# Patient Record
Sex: Female | Born: 1960 | Race: Black or African American | Hispanic: No | Marital: Married | Smoking: Never smoker
Health system: Southern US, Community
[De-identification: ages and names within clinical notes are randomized; demographics above are authoritative.]

## PROBLEM LIST (undated history)

## (undated) DIAGNOSIS — J302 Other seasonal allergic rhinitis: Secondary | ICD-10-CM

## (undated) DIAGNOSIS — E2839 Other primary ovarian failure: Secondary | ICD-10-CM

## (undated) DIAGNOSIS — J45909 Unspecified asthma, uncomplicated: Secondary | ICD-10-CM

## (undated) DIAGNOSIS — F419 Anxiety disorder, unspecified: Secondary | ICD-10-CM

## (undated) DIAGNOSIS — E785 Hyperlipidemia, unspecified: Secondary | ICD-10-CM

## (undated) DIAGNOSIS — E039 Hypothyroidism, unspecified: Secondary | ICD-10-CM

## (undated) DIAGNOSIS — Z862 Personal history of diseases of the blood and blood-forming organs and certain disorders involving the immune mechanism: Secondary | ICD-10-CM

## (undated) DIAGNOSIS — M25551 Pain in right hip: Secondary | ICD-10-CM

## (undated) DIAGNOSIS — N6001 Solitary cyst of right breast: Secondary | ICD-10-CM

## (undated) HISTORY — DX: Hypothyroidism, unspecified: E03.9

## (undated) HISTORY — DX: Other seasonal allergic rhinitis: J30.2

## (undated) HISTORY — DX: Pain in right hip: M25.551

## (undated) HISTORY — DX: Other primary ovarian failure: E28.39

## (undated) HISTORY — DX: Unspecified asthma, uncomplicated: J45.909

## (undated) HISTORY — DX: Morbid (severe) obesity due to excess calories: E66.01

## (undated) HISTORY — DX: Anxiety disorder, unspecified: F41.9

## (undated) HISTORY — DX: Hyperlipidemia, unspecified: E78.5

## (undated) HISTORY — DX: Solitary cyst of right breast: N60.01

## (undated) HISTORY — DX: Personal history of diseases of the blood and blood-forming organs and certain disorders involving the immune mechanism: Z86.2

---

## 1998-09-02 ENCOUNTER — Other Ambulatory Visit: Admission: RE | Admit: 1998-09-02 | Discharge: 1998-09-02 | Payer: Self-pay | Admitting: Obstetrics and Gynecology

## 1998-09-13 ENCOUNTER — Ambulatory Visit (HOSPITAL_COMMUNITY): Admission: RE | Admit: 1998-09-13 | Discharge: 1998-09-13 | Payer: Self-pay | Admitting: Obstetrics and Gynecology

## 1998-09-13 ENCOUNTER — Encounter: Payer: Self-pay | Admitting: Obstetrics and Gynecology

## 2000-09-19 ENCOUNTER — Other Ambulatory Visit: Admission: RE | Admit: 2000-09-19 | Discharge: 2000-09-19 | Payer: Self-pay | Admitting: Obstetrics and Gynecology

## 2001-12-03 ENCOUNTER — Other Ambulatory Visit: Admission: RE | Admit: 2001-12-03 | Discharge: 2001-12-03 | Payer: Self-pay | Admitting: Obstetrics and Gynecology

## 2005-10-09 ENCOUNTER — Encounter: Admission: RE | Admit: 2005-10-09 | Discharge: 2005-10-09 | Payer: Self-pay | Admitting: Internal Medicine

## 2005-11-08 ENCOUNTER — Other Ambulatory Visit: Admission: RE | Admit: 2005-11-08 | Discharge: 2005-11-08 | Payer: Self-pay | Admitting: Obstetrics and Gynecology

## 2005-11-08 ENCOUNTER — Encounter: Admission: RE | Admit: 2005-11-08 | Discharge: 2005-11-08 | Payer: Self-pay | Admitting: Internal Medicine

## 2006-03-16 ENCOUNTER — Encounter: Admission: RE | Admit: 2006-03-16 | Discharge: 2006-03-16 | Payer: Self-pay | Admitting: General Surgery

## 2006-09-21 ENCOUNTER — Encounter: Admission: RE | Admit: 2006-09-21 | Discharge: 2006-09-21 | Payer: Self-pay | Admitting: Obstetrics and Gynecology

## 2006-11-26 ENCOUNTER — Encounter: Admission: RE | Admit: 2006-11-26 | Discharge: 2006-11-26 | Payer: Self-pay | Admitting: Obstetrics and Gynecology

## 2008-04-09 ENCOUNTER — Encounter: Admission: RE | Admit: 2008-04-09 | Discharge: 2008-04-09 | Payer: Self-pay | Admitting: Obstetrics and Gynecology

## 2008-05-07 ENCOUNTER — Encounter: Admission: RE | Admit: 2008-05-07 | Discharge: 2008-05-07 | Payer: Self-pay | Admitting: Obstetrics and Gynecology

## 2008-12-24 ENCOUNTER — Encounter: Admission: RE | Admit: 2008-12-24 | Discharge: 2008-12-24 | Payer: Self-pay | Admitting: Obstetrics and Gynecology

## 2009-10-05 ENCOUNTER — Encounter: Admission: RE | Admit: 2009-10-05 | Discharge: 2009-10-05 | Payer: Self-pay | Admitting: Obstetrics and Gynecology

## 2010-02-18 ENCOUNTER — Encounter: Admission: RE | Admit: 2010-02-18 | Discharge: 2010-02-18 | Payer: Self-pay | Admitting: Internal Medicine

## 2010-05-22 ENCOUNTER — Encounter: Payer: Self-pay | Admitting: Obstetrics and Gynecology

## 2010-06-24 ENCOUNTER — Emergency Department (HOSPITAL_COMMUNITY)
Admission: EM | Admit: 2010-06-24 | Discharge: 2010-06-25 | Disposition: A | Discharge status: Home / Self Care | Payer: Managed Care, Other (non HMO) | Attending: Emergency Medicine | Admitting: Emergency Medicine

## 2010-06-24 ENCOUNTER — Emergency Department (HOSPITAL_COMMUNITY): Payer: Managed Care, Other (non HMO)

## 2010-06-24 DIAGNOSIS — R110 Nausea: Secondary | ICD-10-CM | POA: Insufficient documentation

## 2010-06-24 DIAGNOSIS — R109 Unspecified abdominal pain: Secondary | ICD-10-CM | POA: Insufficient documentation

## 2010-06-24 LAB — POCT PREGNANCY, URINE: Preg Test, Ur: NEGATIVE

## 2010-06-24 LAB — WET PREP, GENITAL
Clue Cells Wet Prep HPF POC: NONE SEEN
Trich, Wet Prep: NONE SEEN

## 2010-06-24 LAB — CBC
HCT: 35.4 % — ABNORMAL LOW (ref 36.0–46.0)
Hemoglobin: 11.3 g/dL — ABNORMAL LOW (ref 12.0–15.0)
MCHC: 31.9 g/dL (ref 30.0–36.0)
MCV: 79.2 fL (ref 78.0–100.0)
RBC: 4.47 MIL/uL (ref 3.87–5.11)
WBC: 11.1 10*3/uL — ABNORMAL HIGH (ref 4.0–10.5)

## 2010-06-24 LAB — COMPREHENSIVE METABOLIC PANEL
ALT: 45 U/L — ABNORMAL HIGH (ref 0–35)
CO2: 24 mEq/L (ref 19–32)
Chloride: 105 mEq/L (ref 96–112)
Creatinine, Ser: 0.96 mg/dL (ref 0.4–1.2)
Glucose, Bld: 121 mg/dL — ABNORMAL HIGH (ref 70–99)
Potassium: 3.6 mEq/L (ref 3.5–5.1)
Total Bilirubin: 0.7 mg/dL (ref 0.3–1.2)
Total Protein: 6.5 g/dL (ref 6.0–8.3)

## 2010-06-24 LAB — URINALYSIS, ROUTINE W REFLEX MICROSCOPIC
Bilirubin Urine: NEGATIVE
Nitrite: NEGATIVE
Specific Gravity, Urine: 1.022 (ref 1.005–1.030)
Urine Glucose, Fasting: NEGATIVE mg/dL
Urobilinogen, UA: 0.2 mg/dL (ref 0.0–1.0)

## 2010-06-24 LAB — DIFFERENTIAL
Basophils Relative: 0 % (ref 0–1)
Eosinophils Absolute: 0 10*3/uL (ref 0.0–0.7)
Lymphocytes Relative: 3 % — ABNORMAL LOW (ref 12–46)
Lymphs Abs: 0.4 10*3/uL — ABNORMAL LOW (ref 0.7–4.0)
Monocytes Absolute: 0.3 10*3/uL (ref 0.1–1.0)
Monocytes Relative: 3 % (ref 3–12)
Neutro Abs: 10.4 10*3/uL — ABNORMAL HIGH (ref 1.7–7.7)

## 2010-06-24 LAB — URINE MICROSCOPIC-ADD ON

## 2010-06-24 MED ORDER — IOHEXOL 300 MG/ML  SOLN
100.0000 mL | Freq: Once | INTRAMUSCULAR | Status: AC | PRN
  Administered 2010-06-24: 100 mL via INTRAVENOUS

## 2010-08-22 ENCOUNTER — Emergency Department (HOSPITAL_BASED_OUTPATIENT_CLINIC_OR_DEPARTMENT_OTHER)
Admission: EM | Admit: 2010-08-22 | Discharge: 2010-08-22 | Disposition: A | Discharge status: Home / Self Care | Payer: Managed Care, Other (non HMO) | Attending: Emergency Medicine | Admitting: Emergency Medicine

## 2010-08-22 DIAGNOSIS — M79609 Pain in unspecified limb: Secondary | ICD-10-CM | POA: Insufficient documentation

## 2010-08-22 DIAGNOSIS — J45909 Unspecified asthma, uncomplicated: Secondary | ICD-10-CM | POA: Insufficient documentation

## 2011-05-10 ENCOUNTER — Other Ambulatory Visit: Payer: Self-pay | Admitting: Obstetrics and Gynecology

## 2011-05-10 DIAGNOSIS — Z1231 Encounter for screening mammogram for malignant neoplasm of breast: Secondary | ICD-10-CM

## 2011-05-19 ENCOUNTER — Ambulatory Visit: Payer: Managed Care, Other (non HMO)

## 2011-06-01 ENCOUNTER — Ambulatory Visit
Admission: RE | Admit: 2011-06-01 | Discharge: 2011-06-01 | Disposition: A | Discharge status: Home / Self Care | Payer: Managed Care, Other (non HMO) | Source: Ambulatory Visit | Attending: Obstetrics and Gynecology | Admitting: Obstetrics and Gynecology

## 2011-06-01 DIAGNOSIS — Z1231 Encounter for screening mammogram for malignant neoplasm of breast: Secondary | ICD-10-CM

## 2011-11-25 ENCOUNTER — Ambulatory Visit: Payer: 59 | Admitting: Emergency Medicine

## 2011-11-25 VITALS — BP 125/88 | HR 92 | Temp 98.2°F | Resp 18 | Ht 65.0 in | Wt 232.4 lb

## 2011-11-25 DIAGNOSIS — R21 Rash and other nonspecific skin eruption: Secondary | ICD-10-CM

## 2011-11-25 DIAGNOSIS — L509 Urticaria, unspecified: Secondary | ICD-10-CM

## 2011-11-25 MED ORDER — PREDNISONE 10 MG PO TABS: ORAL_TABLET | ORAL | Status: DC

## 2011-11-25 MED ORDER — BETAMETHASONE DIPROPIONATE AUG 0.05 % EX CREA: TOPICAL_CREAM | Freq: Two times a day (BID) | CUTANEOUS | Status: AC

## 2011-11-25 MED ORDER — METHYLPREDNISOLONE ACETATE 80 MG/ML IJ SUSP
80.0000 mg | Freq: Once | INTRAMUSCULAR | Status: AC
  Administered 2011-11-25: 80 mg via INTRAMUSCULAR

## 2011-11-25 NOTE — Progress Notes (Signed)
**Note De-identified Umanzor Obfuscation**  **Note De-Identified Watkin Obfuscation** Subjective:    Patient ID: Bethany Carr, female    DOB: [birth year], 51 y.o.   MRN: [id number]  HPI pt presents with rash on chest, back, on eyes. Has used hydrocortisone cream. Started on Wednesday evening. Didn't do anything unusual. Ate leftover chicken. No seafood, nuts. No new creams or lotions. Says this happens about once a year. 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 Used to work at [employer], in the office. Wonders if it has been stress-related. States she doesn't feel stressed now since she switched jobs.     Review of Systems     Objective:   Physical Exam or hives noted over the anterior chest and over the back. Some of these areas are excoriated neck is supple chest is clear .        Assessment & Plan:  Assessment is hives. We'll go ahead and treat with Depo-Medrol prednisone Dosepak continue Claritin one a day Benadryl at night

## 2012-04-27 ENCOUNTER — Ambulatory Visit (INDEPENDENT_AMBULATORY_CARE_PROVIDER_SITE_OTHER): Payer: 59 | Admitting: Family Medicine

## 2012-04-27 VITALS — BP 122/84 | HR 109 | Temp 99.4°F | Resp 16 | Ht 64.0 in | Wt 231.0 lb

## 2012-04-27 DIAGNOSIS — H6690 Otitis media, unspecified, unspecified ear: Secondary | ICD-10-CM

## 2012-04-27 DIAGNOSIS — R05 Cough: Secondary | ICD-10-CM

## 2012-04-27 DIAGNOSIS — R059 Cough, unspecified: Secondary | ICD-10-CM

## 2012-04-27 DIAGNOSIS — H6500 Acute serous otitis media, unspecified ear: Secondary | ICD-10-CM

## 2012-04-27 DIAGNOSIS — R21 Rash and other nonspecific skin eruption: Secondary | ICD-10-CM

## 2012-04-27 DIAGNOSIS — J45909 Unspecified asthma, uncomplicated: Secondary | ICD-10-CM

## 2012-04-27 LAB — POCT CBC
Hemoglobin: 13.6 g/dL (ref 12.2–16.2)
Lymph, poc: 2 (ref 0.6–3.4)
MCH, POC: 26.6 pg — AB (ref 27–31.2)
MCHC: 31 g/dL — AB (ref 31.8–35.4)
MCV: 85.7 fL (ref 80–97)
MID (cbc): 0.4 (ref 0–0.9)
POC MID %: 5.3 %M (ref 0–12)
Platelet Count, POC: 177 10*3/uL (ref 142–424)
RBC: 5.12 M/uL (ref 4.04–5.48)
WBC: 7.7 10*3/uL (ref 4.6–10.2)

## 2012-04-27 LAB — POCT INFLUENZA A/B: Influenza B, POC: NEGATIVE

## 2012-04-27 MED ORDER — HYDROCODONE-HOMATROPINE 5-1.5 MG/5ML PO SYRP: 5.0000 mL | ORAL_SOLUTION | Freq: Three times a day (TID) | ORAL | Status: DC | PRN

## 2012-04-27 MED ORDER — ALBUTEROL SULFATE HFA 108 (90 BASE) MCG/ACT IN AERS: 2.0000 | INHALATION_SPRAY | RESPIRATORY_TRACT | Status: DC | PRN

## 2012-04-27 MED ORDER — PREDNISONE 10 MG PO TABS: ORAL_TABLET | ORAL | Status: DC

## 2012-04-27 MED ORDER — AMOXICILLIN 875 MG PO TABS: 875.0000 mg | ORAL_TABLET | Freq: Three times a day (TID) | ORAL | Status: DC

## 2012-04-27 NOTE — Progress Notes (Signed)
**Note De-Identified Strauser Obfuscation** Subjective:    Patient ID: Bethany Carr, female    DOB: [birth year], 51 y.o.   MRN: [id number]  HPI  7d of chest congestion, rattling, coughing but couldn't get anything up for some time, then started coming up dark yellow-green, now moved into head w/ headache, earache, sinus pressure and nasal congestion. Sore throat with coughing only.  Aching all over.  Has had sweats and chills, skin feels like it was buring. Taking ibuprofen, guaifenisen and sudafed, No n/v. nml uop but constipaiton resolved. tol po and trying to push fluids but knows she prob isn't drinking enough.  Past Medical History  Diagnosis Date  . Asthma    Current Outpatient Prescriptions on File Prior to Visit  Medication Sig Dispense Refill  . augmented betamethasone dipropionate (DIPROLENE AF) 0.05 % cream Apply topically 2 (two) times daily.  50 g  0  . predniSONE (DELTASONE) 10 MG tablet Takes 6 a day for one day 5 a day for one day 4 day for one day 3 a day for one day 2 a day for one day and one a day for one day  21 tablet  0   No Known Allergies   Review of Systems  Constitutional: Positive for chills, diaphoresis, activity change, appetite change and fatigue. Negative for fever and unexpected weight change.  HENT: Positive for ear pain, congestion, rhinorrhea, neck pain and sinus pressure. Negative for nosebleeds, sore throat, facial swelling, sneezing, mouth sores, trouble swallowing, neck stiffness, voice change, postnasal drip and ear discharge.   Eyes: Positive for photophobia. Negative for pain.  Respiratory: Positive for cough, chest tightness and wheezing. Negative for shortness of breath.   Cardiovascular: Negative for chest pain.  Gastrointestinal: Positive for constipation. Negative for nausea, vomiting and abdominal pain.  Genitourinary: Negative for dysuria, frequency and decreased urine volume.  Musculoskeletal: Positive for myalgias and arthralgias. Negative for joint swelling and gait problem.    Neurological: Positive for weakness and headaches. Negative for dizziness, syncope and light-headedness.  Hematological: Negative for adenopathy.  Psychiatric/Behavioral: Positive for sleep disturbance.      BP 122/84  Pulse 109  Temp 99.4 F (37.4 C) (Oral)  Resp 16  Ht 5\' 4"  (1.626 m)  Wt 231 lb (104.781 kg)  BMI 39.65 kg/m2 O2 sat 98% Objective:   Physical Exam  Constitutional: She is oriented to person, place, and time. She appears well-developed and well-nourished. She appears lethargic. She appears ill. No distress.  HENT:  Head: Normocephalic and atraumatic.  Right Ear: External ear and ear canal normal. Tympanic membrane is injected and retracted. A middle ear effusion is present.  Left Ear: External ear and ear canal normal. Tympanic membrane is injected and bulging. A middle ear effusion is present.  Nose: Mucosal edema and rhinorrhea present. Right sinus exhibits maxillary sinus tenderness. Left sinus exhibits maxillary sinus tenderness.  Mouth/Throat: Uvula is midline, oropharynx is clear and moist and mucous membranes are normal. No oropharyngeal exudate, posterior oropharyngeal edema, posterior oropharyngeal erythema or tonsillar abscesses.  Eyes: Conjunctivae normal are normal. Right eye exhibits no discharge. Left eye exhibits no discharge. No scleral icterus.  Neck: Normal range of motion. Neck supple.  Cardiovascular: Normal rate, regular rhythm, normal heart sounds and intact distal pulses.   Pulmonary/Chest: Effort normal. No accessory muscle usage. No respiratory distress. She has decreased breath sounds. She has wheezes in the right lower field and the left lower field.  Lymphadenopathy:       Head (right side): Submandibular adenopathy present. **Note De-Identified Guillen Obfuscation** No preauricular and no posterior auricular adenopathy present.       Head (left side): Submandibular adenopathy present. No preauricular and no posterior auricular adenopathy present.    She has no cervical adenopathy.        Right: No supraclavicular adenopathy present.       Left: No supraclavicular adenopathy present.  Neurological: She is oriented to person, place, and time. She appears lethargic.  Skin: Skin is warm and dry. She is not diaphoretic. No erythema.  Psychiatric: She has a normal mood and affect. Her behavior is normal.      Results for orders placed in visit on 04/27/12  POCT INFLUENZA A/B      Component Value Range   Influenza A, POC Negative     Influenza B, POC Negative    POCT CBC      Component Value Range   WBC 7.7  4.6 - 10.2 K/uL   Lymph, poc 2.0  0.6 - 3.4   POC LYMPH PERCENT 25.4  10 - 50 %L   MID (cbc) 0.4  0 - 0.9   POC MID % 5.3  0 - 12 %M   POC Granulocyte 5.3  2 - 6.9   Granulocyte percent 69.3  37 - 80 %G   RBC 5.12  4.04 - 5.48 M/uL   Hemoglobin 13.6  12.2 - 16.2 g/dL   HCT, POC 16.1  09.6 - 47.9 %   MCV 85.7  80 - 97 fL   MCH, POC 26.6 (*) 27 - 31.2 pg   MCHC 31.0 (*) 31.8 - 35.4 g/dL   RDW, POC 04.5     Platelet Count, POC 177  142 - 424 K/uL   MPV 11.2  0 - 99.8 fL    Assessment & Plan:   1. Cough  POCT Influenza A/B, POCT CBC, HYDROcodone-homatropine (HYCODAN) 5-1.5 MG/5ML syrup      3. Otitis media  amoxicillin (AMOXIL) 875 MG tablet  4. Asthma  predniSONE (DELTASONE) 10 MG tablet; albuterol inhaler q4hrs prn

## 2013-02-17 ENCOUNTER — Other Ambulatory Visit: Payer: Self-pay

## 2013-02-17 DIAGNOSIS — Z1231 Encounter for screening mammogram for malignant neoplasm of breast: Secondary | ICD-10-CM

## 2013-03-18 ENCOUNTER — Ambulatory Visit
Admission: RE | Admit: 2013-03-18 | Discharge: 2013-03-18 | Disposition: A | Discharge status: Home / Self Care | Payer: 59 | Source: Ambulatory Visit

## 2013-03-18 DIAGNOSIS — Z1231 Encounter for screening mammogram for malignant neoplasm of breast: Secondary | ICD-10-CM

## 2013-08-12 ENCOUNTER — Other Ambulatory Visit (HOSPITAL_COMMUNITY)
Admission: RE | Admit: 2013-08-12 | Discharge: 2013-08-12 | Disposition: A | Discharge status: Home / Self Care | Payer: 59 | Source: Ambulatory Visit | Attending: Internal Medicine | Admitting: Internal Medicine

## 2013-08-12 ENCOUNTER — Other Ambulatory Visit: Payer: Self-pay | Admitting: Internal Medicine

## 2013-08-12 DIAGNOSIS — Z124 Encounter for screening for malignant neoplasm of cervix: Secondary | ICD-10-CM | POA: Insufficient documentation

## 2013-08-12 DIAGNOSIS — Z1151 Encounter for screening for human papillomavirus (HPV): Secondary | ICD-10-CM | POA: Insufficient documentation

## 2014-08-18 ENCOUNTER — Other Ambulatory Visit: Payer: Self-pay | Admitting: Internal Medicine

## 2014-08-21 LAB — CYTOLOGY - PAP

## 2015-08-19 ENCOUNTER — Other Ambulatory Visit (HOSPITAL_COMMUNITY)
Admission: RE | Admit: 2015-08-19 | Discharge: 2015-08-19 | Disposition: A | Discharge status: Home / Self Care | Payer: Federal, State, Local not specified - PPO | Source: Ambulatory Visit | Attending: Internal Medicine | Admitting: Internal Medicine

## 2015-08-19 ENCOUNTER — Other Ambulatory Visit: Payer: Self-pay | Admitting: Internal Medicine

## 2015-08-19 DIAGNOSIS — Z01419 Encounter for gynecological examination (general) (routine) without abnormal findings: Secondary | ICD-10-CM | POA: Diagnosis not present

## 2015-08-19 DIAGNOSIS — Z0000 Encounter for general adult medical examination without abnormal findings: Secondary | ICD-10-CM | POA: Diagnosis not present

## 2015-08-19 DIAGNOSIS — R109 Unspecified abdominal pain: Secondary | ICD-10-CM | POA: Diagnosis not present

## 2015-08-23 LAB — CYTOLOGY - PAP

## 2015-09-17 DIAGNOSIS — L659 Nonscarring hair loss, unspecified: Secondary | ICD-10-CM | POA: Diagnosis not present

## 2015-09-17 DIAGNOSIS — D225 Melanocytic nevi of trunk: Secondary | ICD-10-CM | POA: Diagnosis not present

## 2015-11-29 DIAGNOSIS — K080 Exfoliation of teeth due to systemic causes: Secondary | ICD-10-CM | POA: Diagnosis not present

## 2015-12-06 ENCOUNTER — Ambulatory Visit
Admission: RE | Admit: 2015-12-06 | Discharge: 2015-12-06 | Disposition: A | Discharge status: Home / Self Care | Payer: Federal, State, Local not specified - PPO | Source: Ambulatory Visit | Attending: Internal Medicine | Admitting: Internal Medicine

## 2015-12-06 ENCOUNTER — Other Ambulatory Visit: Payer: Self-pay | Admitting: Internal Medicine

## 2015-12-06 ENCOUNTER — Other Ambulatory Visit: Payer: Self-pay | Admitting: Obstetrics and Gynecology

## 2015-12-06 ENCOUNTER — Other Ambulatory Visit: Payer: Self-pay | Admitting: Interventional Cardiology

## 2015-12-06 DIAGNOSIS — Z1231 Encounter for screening mammogram for malignant neoplasm of breast: Secondary | ICD-10-CM

## 2015-12-13 ENCOUNTER — Other Ambulatory Visit: Payer: Self-pay | Admitting: Internal Medicine

## 2015-12-13 DIAGNOSIS — R928 Other abnormal and inconclusive findings on diagnostic imaging of breast: Secondary | ICD-10-CM | POA: Diagnosis not present

## 2015-12-13 DIAGNOSIS — N63 Unspecified lump in breast: Secondary | ICD-10-CM | POA: Diagnosis not present

## 2015-12-13 DIAGNOSIS — E785 Hyperlipidemia, unspecified: Secondary | ICD-10-CM | POA: Diagnosis not present

## 2015-12-13 DIAGNOSIS — Z6841 Body Mass Index (BMI) 40.0 and over, adult: Secondary | ICD-10-CM | POA: Diagnosis not present

## 2015-12-15 ENCOUNTER — Ambulatory Visit
Admission: RE | Admit: 2015-12-15 | Discharge: 2015-12-15 | Disposition: A | Discharge status: Home / Self Care | Payer: Federal, State, Local not specified - PPO | Source: Ambulatory Visit | Attending: Internal Medicine | Admitting: Internal Medicine

## 2015-12-15 DIAGNOSIS — R928 Other abnormal and inconclusive findings on diagnostic imaging of breast: Secondary | ICD-10-CM

## 2015-12-15 DIAGNOSIS — N6001 Solitary cyst of right breast: Secondary | ICD-10-CM | POA: Diagnosis not present

## 2015-12-27 DIAGNOSIS — R7301 Impaired fasting glucose: Secondary | ICD-10-CM | POA: Diagnosis not present

## 2015-12-27 DIAGNOSIS — R928 Other abnormal and inconclusive findings on diagnostic imaging of breast: Secondary | ICD-10-CM | POA: Diagnosis not present

## 2015-12-27 DIAGNOSIS — E785 Hyperlipidemia, unspecified: Secondary | ICD-10-CM | POA: Diagnosis not present

## 2015-12-27 DIAGNOSIS — Z6841 Body Mass Index (BMI) 40.0 and over, adult: Secondary | ICD-10-CM | POA: Diagnosis not present

## 2016-01-12 DIAGNOSIS — K080 Exfoliation of teeth due to systemic causes: Secondary | ICD-10-CM | POA: Diagnosis not present

## 2016-03-13 DIAGNOSIS — E785 Hyperlipidemia, unspecified: Secondary | ICD-10-CM | POA: Diagnosis not present

## 2016-03-21 DIAGNOSIS — R5383 Other fatigue: Secondary | ICD-10-CM | POA: Diagnosis not present

## 2016-03-21 DIAGNOSIS — Z79899 Other long term (current) drug therapy: Secondary | ICD-10-CM | POA: Diagnosis not present

## 2016-03-21 DIAGNOSIS — L659 Nonscarring hair loss, unspecified: Secondary | ICD-10-CM | POA: Diagnosis not present

## 2016-03-22 ENCOUNTER — Ambulatory Visit (INDEPENDENT_AMBULATORY_CARE_PROVIDER_SITE_OTHER): Payer: Federal, State, Local not specified - PPO | Admitting: Interventional Cardiology

## 2016-03-22 ENCOUNTER — Encounter: Payer: Self-pay | Admitting: Interventional Cardiology

## 2016-03-22 VITALS — BP 130/71 | HR 88 | Ht 62.5 in | Wt 241.0 lb

## 2016-03-22 DIAGNOSIS — Z8249 Family history of ischemic heart disease and other diseases of the circulatory system: Secondary | ICD-10-CM | POA: Diagnosis not present

## 2016-03-22 DIAGNOSIS — R600 Localized edema: Secondary | ICD-10-CM

## 2016-03-22 DIAGNOSIS — R609 Edema, unspecified: Secondary | ICD-10-CM

## 2016-03-22 DIAGNOSIS — R0683 Snoring: Secondary | ICD-10-CM | POA: Diagnosis not present

## 2016-03-22 DIAGNOSIS — I10 Essential (primary) hypertension: Secondary | ICD-10-CM | POA: Insufficient documentation

## 2016-03-22 NOTE — Patient Instructions (Signed)
**Note De-Identified Mells Obfuscation** Medication Instructions:  None  Labwork: None  Testing/Procedures: Your physician has requested that you have an echocardiogram. Echocardiography is a painless test that uses sound waves to create images of your heart. It provides your doctor with information about the size and shape of your heart and how well your heart's chambers and valves are working. This procedure takes approximately one hour. There are no restrictions for this procedure.  Your physician has requested that you have an abdominal aorta duplex. During this test, an ultrasound is used to evaluate the aorta. Allow 30 minutes for this exam. Do not eat after midnight the day before and avoid carbonated beverages   Follow-Up: Your physician recommends that you schedule a follow-up appointment as needed with Dr. [name].   Any Other Special Instructions Will Be Listed Below (If Applicable).     If you need a refill on your cardiac medications before your next appointment, please call your pharmacy.

## 2016-03-22 NOTE — Progress Notes (Signed)
**Note De-Identified Millett Obfuscation** Cardiology Office Note    Date:  03/22/2016   ID:  Bethany Carr, DOB April 23, 1961, MRN [id number]  PCP:  Lorenda Ishiharaupashree Varadarajan, MD  Cardiologist: Lesleigh NoeHenry W Smith III, MD   Chief Complaint  Patient presents with  . Leg Swelling  . 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  Hypertension is been present for several years. She has no cardiopulmonary complaints. She has low bilateral lower extremity edema. Lower extremity edema is hereditary and has been noticed in her aunts and her mother. She has no known history of heart disease. She has never had pulmonary emboli DVT. She denies exertional dyspnea, orthopnea, and PND.    Past Medical History:  Diagnosis Date  . Asthma     Past Surgical History:  Procedure Laterality Date  . CESAREAN SECTION      Current Medications: Outpatient Medications Prior to Visit  Medication Sig Dispense Refill  . albuterol (PROVENTIL HFA;VENTOLIN HFA) 108 (90 BASE) MCG/ACT inhaler Inhale 2 puffs into the lungs every 4 (four) hours as needed for wheezing (cough, shortness of breath or wheezing.). 1 Inhaler 1  . amoxicillin (AMOXIL) 875 MG tablet Take 1 tablet (875 mg total) by mouth 3 (three) times daily. 30 tablet 0  . HYDROcodone-homatropine (HYCODAN) 5-1.5 MG/5ML syrup Take 5 mLs by mouth every 8 (eight) hours as needed for cough. 120 mL 0  . predniSONE (DELTASONE) 10 MG tablet Takes 6 a day for one day 5 a day for one day 4 day for one day 3 a day for one day 2 a day for one day and one a day for one day 21 tablet 0   No facility-administered medications prior to visit.      Allergies:   Patient has no known allergies.   Social History   Social History  . Marital status: Married    Spouse name: N/A  . Number of children: N/A  . Years of education: N/A   Social  History Main Topics  . Smoking status: Never Smoker  . Smokeless tobacco: Never Used  . Alcohol use No  . Drug use: No  . Sexual activity: Yes   Other Topics Concern  . None   Social History Narrative  . None     Family History:  T family history includes Aortic aneurysm in her brother and father; Breast cancer in her mother and sister; Healthy in her brother and brother; Hypertension in her mother.   ROS:   Please see the history of present illness.    She snores significantly. She does not have excessive daytime sleepiness. She has occasional cough.  All other systems reviewed and are negative.   PHYSICAL EXAM:   VS:  BP 130/71 (BP Location: Left Arm)   Pulse 88   Ht 5' 2.5" (1.588 m)   Wt 241 lb (109.3 kg)   LMP 05/13/2011   BMI 43.38 kg/m    GEN: Well nourished, well developed, in no acute distress . Morbid obesity. HEENT: normal  Neck: no JVD, carotid bruits, or masses Cardiac: RRR; no murmurs, rubs, or gallops,no edema  Respiratory:  clear to auscultation bilaterally, normal work of breathing GI: soft, nontender, nondistended, + BS MS: no deformity or atrophy  Skin: warm and dry, no rash Neuro:  Alert and Oriented x 3, Strength and **Note De-Identified Menard Obfuscation** sensation are intact Psych: euthymic mood, full affect  Wt Readings from Last 3 Encounters:  03/22/16 241 lb (109.3 kg)  04/27/12 231 lb (104.8 kg)  11/25/11 232 lb 6.4 oz (105.4 kg)      Studies/Labs Reviewed:   EKG:  EKG  Normal sinus rhythm, left atrial abdomen mallet, otherwise unremarkable.  Recent Labs: No results found for requested labs within last 8760 hours.   Lipid Panel No results found for: CHOL, TRIG, HDL, CHOLHDL, VLDL, LDLCALC, LDLDIRECT  Additional studies/ records that were reviewed today include:  No recent scans or cardiac imaging    ASSESSMENT:    1. Peripheral edema   2. Family history of abdominal aortic aneurysm (AAA)   3. Essential hypertension   4. Morbid obesity (HCC)   5. Snoring       PLAN:  In order of problems listed above:  1. In absence of dyspnea, peripheral edema is probably not cardiac related. Given her obesity and history of snoring, we need to rule out pulmonary hypertension. I suspect LV function will be normal. Peripheral edema may be related to venous insufficiency. 2. Duplex scan of the abdominal aorta to rule out abdominal aortic aneurysm that has caused sudden death in both her brother and father at age is 5767 and 2370. 3. Borderline control today. Aggravated by hypertension. Rule out sleep apnea. 4. Encouraged aerobic activity and weight reduction. Could be a predisposing factor for sleep apnea that could be contributing to pulmonary hypertension and essential hypertension. 5. A sleep study needs to be considered. I will press this issue if there is evidence of pulmonary hypertension on echocardiography.    Medication Adjustments/Labs and Tests Ordered: Current medicines are reviewed at length with the patient today.  Concerns regarding medicines are outlined above.  Medication changes, Labs and Tests ordered today are listed in the Patient Instructions below. Patient Instructions  Medication Instructions:  None  Labwork: None  Testing/Procedures: Your physician has requested that you have an echocardiogram. Echocardiography is a painless test that uses sound waves to create images of your heart. It provides your doctor with information about the size and shape of your heart and how well your heart's chambers and valves are working. This procedure takes approximately one hour. There are no restrictions for this procedure.  Your physician has requested that you have an abdominal aorta duplex. During this test, an ultrasound is used to evaluate the aorta. Allow 30 minutes for this exam. Do not eat after midnight the day before and avoid carbonated beverages   Follow-Up: Your physician recommends that you schedule a follow-up appointment as needed  with Dr. [name].   Any Other Special Instructions Will Be Listed Below (If Applicable).     If you need a refill on your cardiac medications before your next appointment, please call your pharmacy.      Signed, Lesleigh NoeHenry W Smith III, MD  03/22/2016 3:58 PM    [facility] [address], [place], [place]  [address] Phone: [phone]; Fax: [phone]

## 2016-04-17 ENCOUNTER — Other Ambulatory Visit (HOSPITAL_COMMUNITY): Payer: Federal, State, Local not specified - PPO

## 2016-04-20 ENCOUNTER — Inpatient Hospital Stay (HOSPITAL_COMMUNITY): Admission: RE | Admit: 2016-04-20 | Payer: Federal, State, Local not specified - PPO | Source: Ambulatory Visit

## 2016-05-08 ENCOUNTER — Other Ambulatory Visit: Payer: Self-pay

## 2016-05-08 ENCOUNTER — Ambulatory Visit (HOSPITAL_COMMUNITY): Payer: Federal, State, Local not specified - PPO | Attending: Cardiology

## 2016-05-08 DIAGNOSIS — Z6841 Body Mass Index (BMI) 40.0 and over, adult: Secondary | ICD-10-CM | POA: Diagnosis not present

## 2016-05-08 DIAGNOSIS — I10 Essential (primary) hypertension: Secondary | ICD-10-CM | POA: Insufficient documentation

## 2016-05-08 DIAGNOSIS — R609 Edema, unspecified: Secondary | ICD-10-CM | POA: Diagnosis not present

## 2016-05-12 ENCOUNTER — Other Ambulatory Visit (HOSPITAL_COMMUNITY): Payer: Federal, State, Local not specified - PPO

## 2016-05-23 ENCOUNTER — Ambulatory Visit (HOSPITAL_COMMUNITY)
Admission: RE | Admit: 2016-05-23 | Discharge: 2016-05-23 | Disposition: A | Discharge status: Home / Self Care | Payer: Federal, State, Local not specified - PPO | Source: Ambulatory Visit | Attending: Cardiovascular Disease | Admitting: Cardiovascular Disease

## 2016-05-23 DIAGNOSIS — I10 Essential (primary) hypertension: Secondary | ICD-10-CM | POA: Diagnosis not present

## 2016-05-23 DIAGNOSIS — Z8249 Family history of ischemic heart disease and other diseases of the circulatory system: Secondary | ICD-10-CM

## 2016-05-30 ENCOUNTER — Telehealth: Payer: Self-pay | Admitting: *Deleted

## 2016-05-30 NOTE — Telephone Encounter (Signed)
**Note De-identified Stogner Obfuscation** error 

## 2016-06-06 DIAGNOSIS — K080 Exfoliation of teeth due to systemic causes: Secondary | ICD-10-CM | POA: Diagnosis not present

## 2016-07-04 DIAGNOSIS — L658 Other specified nonscarring hair loss: Secondary | ICD-10-CM | POA: Diagnosis not present

## 2016-10-10 DIAGNOSIS — L658 Other specified nonscarring hair loss: Secondary | ICD-10-CM | POA: Diagnosis not present

## 2016-10-12 DIAGNOSIS — R635 Abnormal weight gain: Secondary | ICD-10-CM | POA: Diagnosis not present

## 2016-10-12 DIAGNOSIS — Z0000 Encounter for general adult medical examination without abnormal findings: Secondary | ICD-10-CM | POA: Diagnosis not present

## 2016-12-14 DIAGNOSIS — E039 Hypothyroidism, unspecified: Secondary | ICD-10-CM | POA: Diagnosis not present

## 2016-12-14 DIAGNOSIS — F418 Other specified anxiety disorders: Secondary | ICD-10-CM | POA: Diagnosis not present

## 2017-01-02 DIAGNOSIS — K080 Exfoliation of teeth due to systemic causes: Secondary | ICD-10-CM | POA: Diagnosis not present

## 2017-01-16 DIAGNOSIS — L659 Nonscarring hair loss, unspecified: Secondary | ICD-10-CM | POA: Diagnosis not present

## 2017-02-27 DIAGNOSIS — L659 Nonscarring hair loss, unspecified: Secondary | ICD-10-CM | POA: Diagnosis not present

## 2017-04-05 DIAGNOSIS — E785 Hyperlipidemia, unspecified: Secondary | ICD-10-CM | POA: Diagnosis not present

## 2017-05-15 DIAGNOSIS — L659 Nonscarring hair loss, unspecified: Secondary | ICD-10-CM | POA: Diagnosis not present

## 2017-05-21 IMAGING — MG 2D DIGITAL SCREENING BILATERAL MAMMOGRAM WITH CAD AND ADJUNCT TO
8 of 14 series · 8 of 30 positions shown · non-contrast
Comparison: Previous exam(s).

CLINICAL DATA: Screening.

EXAM:
2D DIGITAL SCREENING BILATERAL MAMMOGRAM WITH CAD AND ADJUNCT TOMO

[L CV]
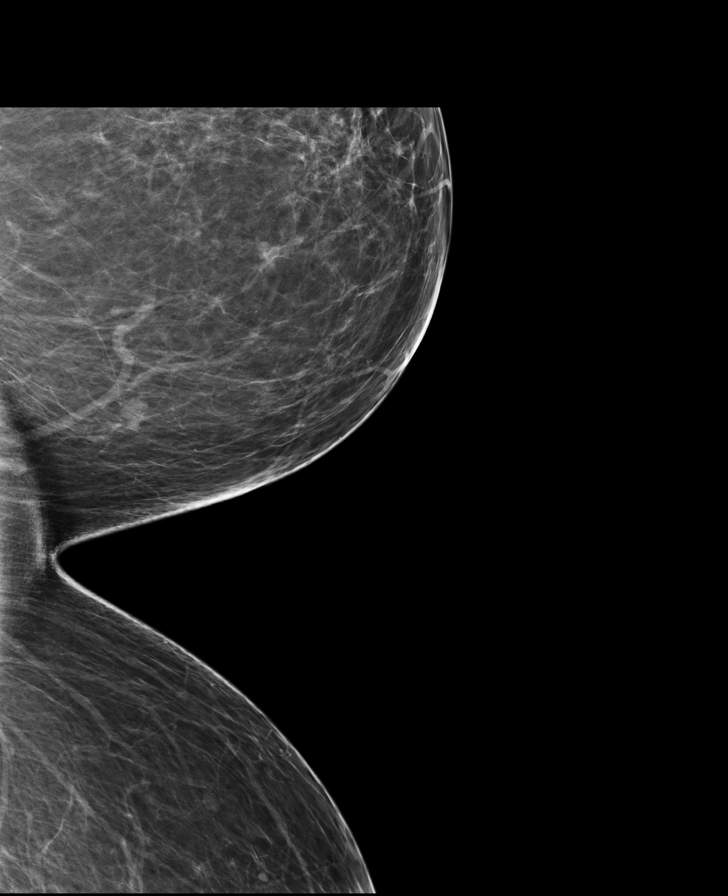

[R MLO]
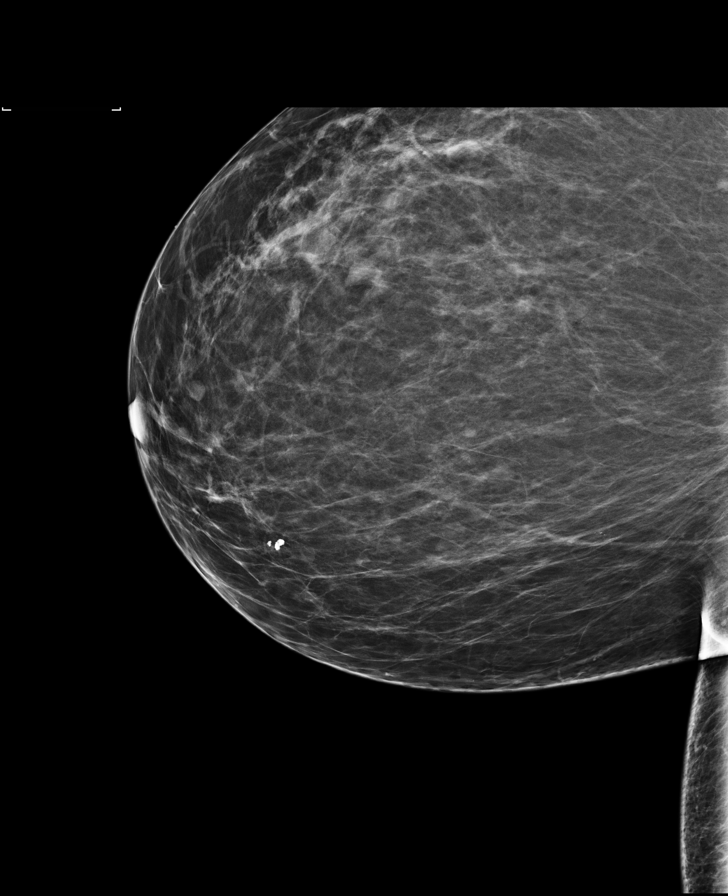

[L MLO]
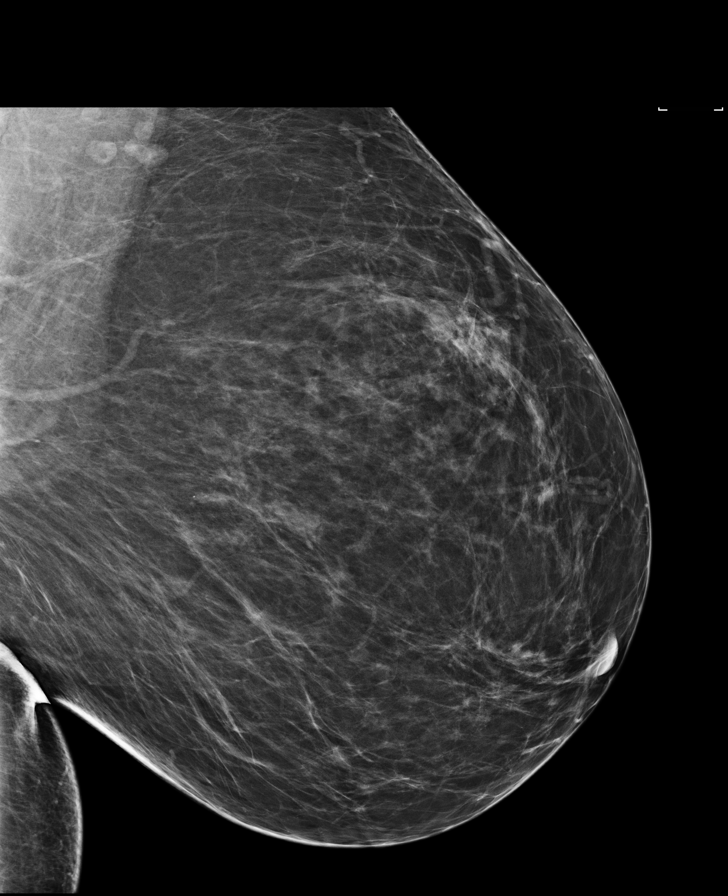

[R CC]
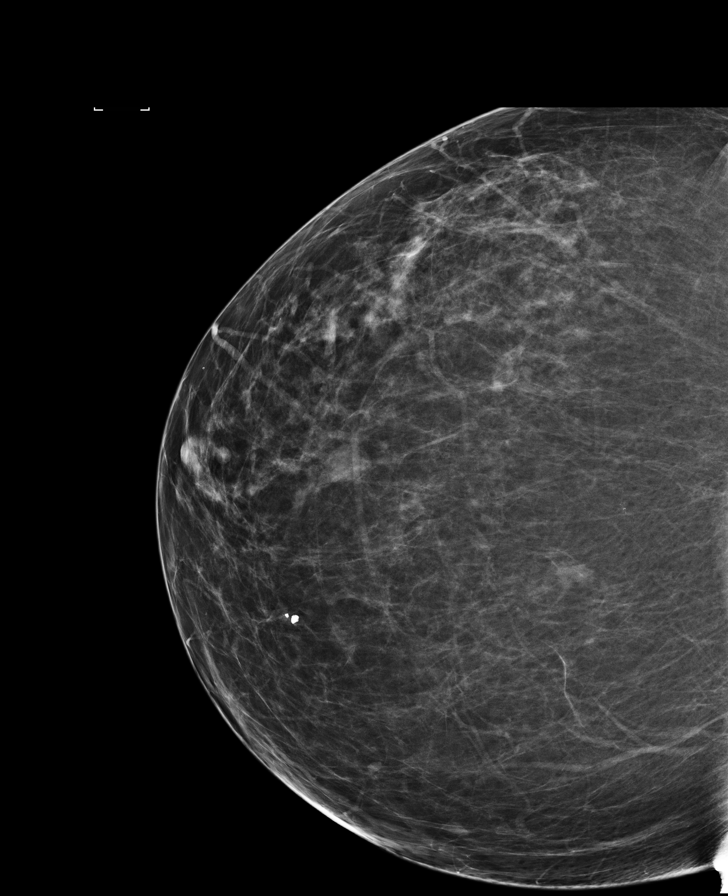

[R CC synth-2D]
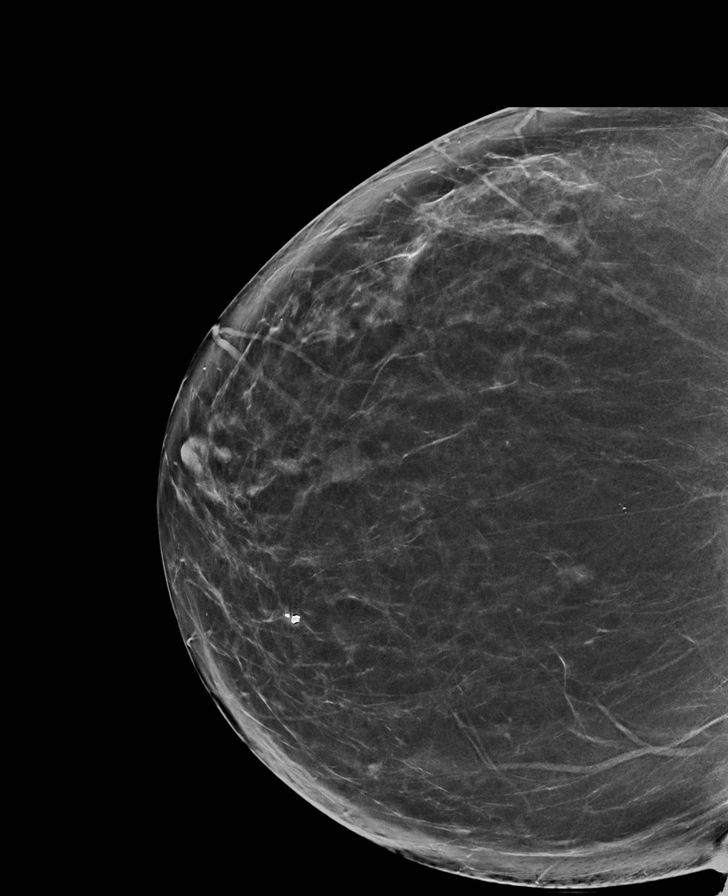

[L CC]
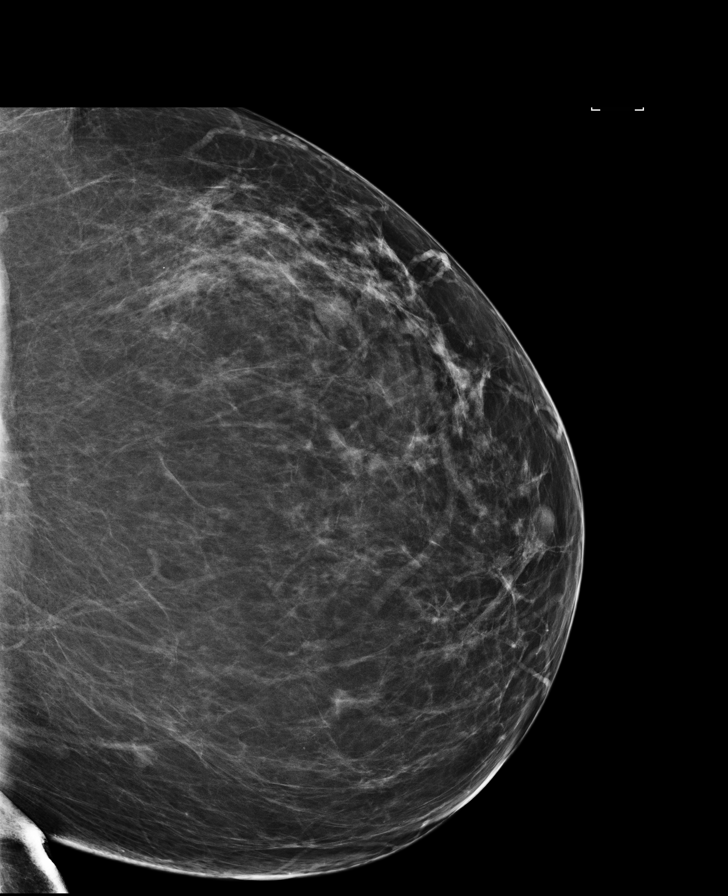

[L MLO synth-2D]
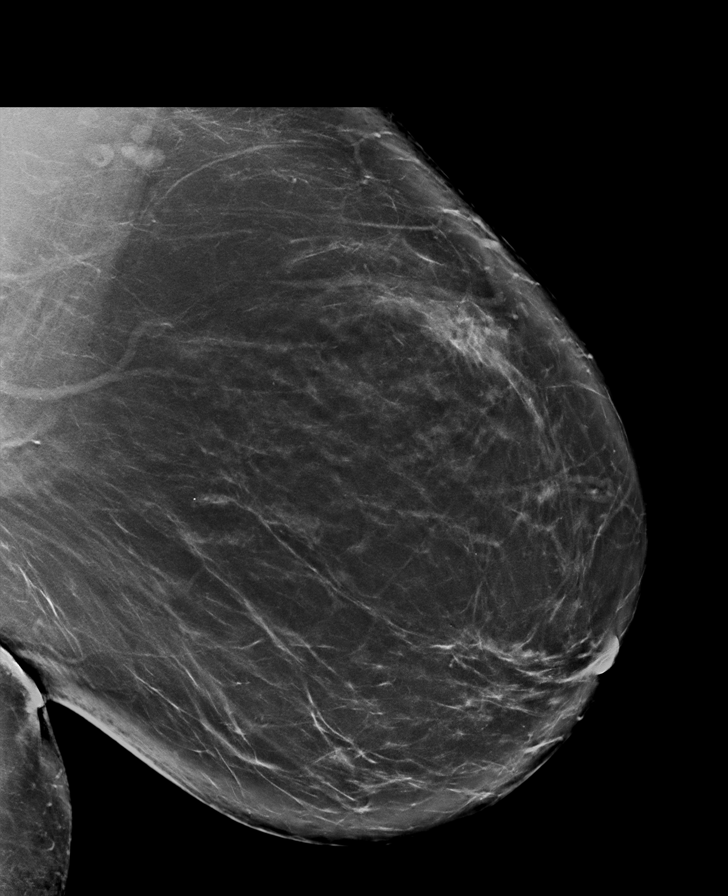

[L CC synth-2D]
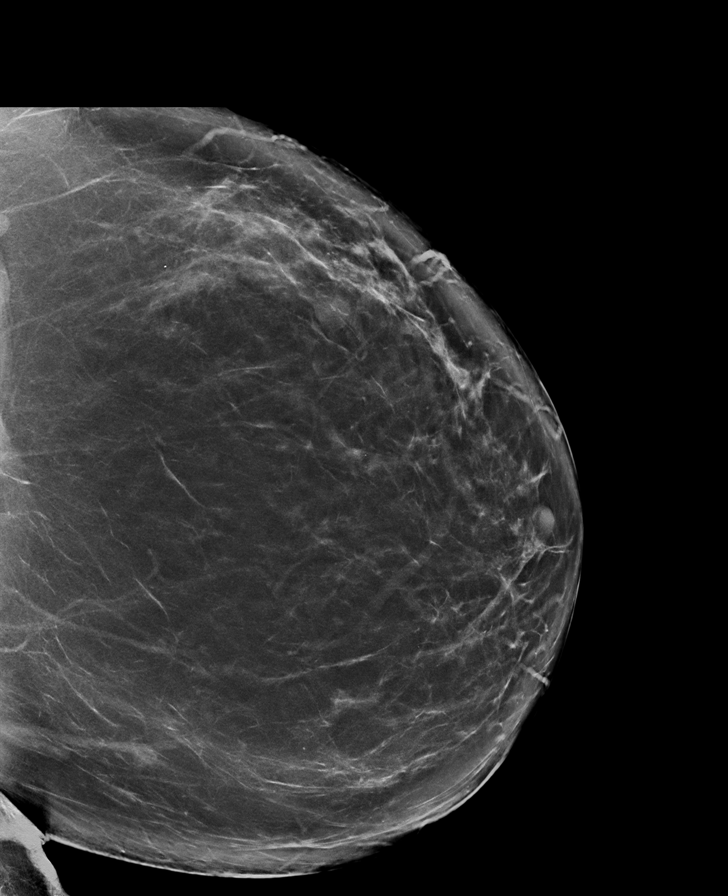

[8 of 30 positions shown; findings below may reference images not displayed]

ACR Breast Density Category b: There are scattered areas of
fibroglandular density.
FINDINGS: In the right breast, a possible mass warrants further evaluation. In
the left breast, no findings suspicious for malignancy. Images were
processed with CAD.
IMPRESSION: Further evaluation is suggested for possible mass in the right
breast.

RECOMMENDATION:
Ultrasound of the right breast. (Code:[id number])

The patient will be contacted regarding the findings, and additional
imaging will be scheduled.

BI-RADS CATEGORY  0: Incomplete. Need additional imaging evaluation
and/or prior mammograms for comparison.

## 2017-06-28 DIAGNOSIS — L649 Androgenic alopecia, unspecified: Secondary | ICD-10-CM | POA: Diagnosis not present

## 2017-06-28 DIAGNOSIS — L659 Nonscarring hair loss, unspecified: Secondary | ICD-10-CM | POA: Diagnosis not present

## 2017-07-12 DIAGNOSIS — K080 Exfoliation of teeth due to systemic causes: Secondary | ICD-10-CM | POA: Diagnosis not present

## 2017-10-23 ENCOUNTER — Other Ambulatory Visit (HOSPITAL_COMMUNITY)
Admission: RE | Admit: 2017-10-23 | Discharge: 2017-10-23 | Disposition: A | Discharge status: Home / Self Care | Payer: Federal, State, Local not specified - PPO | Source: Ambulatory Visit | Attending: Internal Medicine | Admitting: Internal Medicine

## 2017-10-23 ENCOUNTER — Other Ambulatory Visit: Payer: Self-pay | Admitting: Internal Medicine

## 2017-10-23 DIAGNOSIS — E785 Hyperlipidemia, unspecified: Secondary | ICD-10-CM | POA: Diagnosis not present

## 2017-10-23 DIAGNOSIS — Z1231 Encounter for screening mammogram for malignant neoplasm of breast: Secondary | ICD-10-CM

## 2017-10-23 DIAGNOSIS — Z01411 Encounter for gynecological examination (general) (routine) with abnormal findings: Secondary | ICD-10-CM | POA: Diagnosis not present

## 2017-10-23 DIAGNOSIS — E039 Hypothyroidism, unspecified: Secondary | ICD-10-CM | POA: Diagnosis not present

## 2017-10-23 DIAGNOSIS — Z124 Encounter for screening for malignant neoplasm of cervix: Secondary | ICD-10-CM | POA: Diagnosis not present

## 2017-10-23 DIAGNOSIS — Z0000 Encounter for general adult medical examination without abnormal findings: Secondary | ICD-10-CM | POA: Diagnosis not present

## 2017-10-25 LAB — CYTOLOGY - PAP
Chlamydia: NEGATIVE
Diagnosis: NEGATIVE
Neisseria Gonorrhea: NEGATIVE

## 2017-11-14 ENCOUNTER — Ambulatory Visit: Payer: Federal, State, Local not specified - PPO

## 2017-12-05 DIAGNOSIS — R21 Rash and other nonspecific skin eruption: Secondary | ICD-10-CM | POA: Diagnosis not present

## 2017-12-05 DIAGNOSIS — L259 Unspecified contact dermatitis, unspecified cause: Secondary | ICD-10-CM | POA: Diagnosis not present

## 2018-01-22 DIAGNOSIS — K080 Exfoliation of teeth due to systemic causes: Secondary | ICD-10-CM | POA: Diagnosis not present

## 2018-04-16 DIAGNOSIS — E785 Hyperlipidemia, unspecified: Secondary | ICD-10-CM | POA: Diagnosis not present

## 2018-04-16 DIAGNOSIS — I5030 Unspecified diastolic (congestive) heart failure: Secondary | ICD-10-CM | POA: Diagnosis not present

## 2018-04-16 DIAGNOSIS — L659 Nonscarring hair loss, unspecified: Secondary | ICD-10-CM | POA: Diagnosis not present

## 2018-04-16 DIAGNOSIS — E039 Hypothyroidism, unspecified: Secondary | ICD-10-CM | POA: Diagnosis not present

## 2018-06-11 ENCOUNTER — Ambulatory Visit
Admission: RE | Admit: 2018-06-11 | Discharge: 2018-06-11 | Disposition: A | Discharge status: Home / Self Care | Payer: Federal, State, Local not specified - PPO | Source: Ambulatory Visit | Attending: Internal Medicine | Admitting: Internal Medicine

## 2018-06-11 DIAGNOSIS — Z1231 Encounter for screening mammogram for malignant neoplasm of breast: Secondary | ICD-10-CM | POA: Diagnosis not present

## 2018-08-15 DIAGNOSIS — L668 Other cicatricial alopecia: Secondary | ICD-10-CM | POA: Diagnosis not present

## 2019-02-25 DIAGNOSIS — E039 Hypothyroidism, unspecified: Secondary | ICD-10-CM | POA: Diagnosis not present

## 2019-02-25 DIAGNOSIS — E785 Hyperlipidemia, unspecified: Secondary | ICD-10-CM | POA: Diagnosis not present

## 2019-02-25 DIAGNOSIS — Z0000 Encounter for general adult medical examination without abnormal findings: Secondary | ICD-10-CM | POA: Diagnosis not present

## 2019-02-25 DIAGNOSIS — Z79899 Other long term (current) drug therapy: Secondary | ICD-10-CM | POA: Diagnosis not present

## 2019-03-20 ENCOUNTER — Ambulatory Visit: Payer: Federal, State, Local not specified - PPO | Admitting: Allergy & Immunology

## 2019-03-20 ENCOUNTER — Telehealth: Payer: Self-pay | Admitting: Allergy & Immunology

## 2019-03-20 ENCOUNTER — Encounter: Payer: Self-pay | Admitting: Allergy & Immunology

## 2019-03-20 ENCOUNTER — Other Ambulatory Visit: Payer: Self-pay

## 2019-03-20 VITALS — BP 110/86 | HR 93 | Temp 97.4°F | Resp 18 | Ht 63.0 in | Wt 238.0 lb

## 2019-03-20 DIAGNOSIS — J4520 Mild intermittent asthma, uncomplicated: Secondary | ICD-10-CM | POA: Diagnosis not present

## 2019-03-20 DIAGNOSIS — T50B95D Adverse effect of other viral vaccines, subsequent encounter: Secondary | ICD-10-CM

## 2019-03-20 DIAGNOSIS — T7800XD Anaphylactic reaction due to unspecified food, subsequent encounter: Secondary | ICD-10-CM | POA: Diagnosis not present

## 2019-03-20 DIAGNOSIS — J310 Chronic rhinitis: Secondary | ICD-10-CM | POA: Diagnosis not present

## 2019-03-20 DIAGNOSIS — Z23 Encounter for immunization: Secondary | ICD-10-CM | POA: Diagnosis not present

## 2019-03-20 NOTE — Telephone Encounter (Signed)
**Note De-Identified Sotto Obfuscation** Called patient to check on her. She is doing well following the flu shot challenge today. She is not having any problems at all.   Salvatore Marvel, MD [facility]

## 2019-03-20 NOTE — Patient Instructions (Addendum)
**Note De-Identified Montanye Obfuscation** 1. Intermittent asthma, uncomplicated - Lung testing looked great today. - There does not appear to be a need for a controller medication. - Continue with albuterol 2-4 puffs every 4-6 hours as needed.   2. Concern for flu vaccine reaction - Testing to the flu vaccine was negative. - We did give you 10% of the flu vaccine dose followed by 90% of the dose. - You tolerated these without adverse event.  - Be in touch with [place] over the next couple of weeks to let us know how you do.  3. Food allergy (cayenne pepper) - Labs sent to look for a cayenne pepper allergy. - We are also sending a serum tryptase to look for a mast cell disease, which I feel is fairly unlikely. - EpiPen training deferred, pending lab results.   4. Chronic rhinitis - Continue with Alavert daily as needed. - Add on fluticasone nasal spray one spray per nostril daily to help with the congestion. - We can certainly consider allergy testing in the future if needed.  5. Return in about 3 months (around 06/20/2019). This can be an in-person, a virtual Webex or a telephone follow up visit.   Please inform us of any Emergency Department visits, hospitalizations, or changes in symptoms. Call us before going to the ED for breathing or allergy symptoms since we might be able to fit you in for a sick visit. Feel free to contact us anytime with any questions, problems, or concerns.  It was a pleasure to meet you today!  Websites that have reliable patient information: 1. American Academy of Asthma, Allergy, and Immunology: www.aaaai.org 2. Food Allergy Research and Education (FARE): foodallergy.org 3. Mothers of Asthmatics: http://www.asthmacommunitynetwork.org 4. American College of Allergy, Asthma, and Immunology: www.acaai.org  "Like" [place] on Facebook and Instagram for our latest updates!      Make sure you are registered to vote! If you have moved or changed any of your contact information, you will need to get this  updated before voting!  In some cases, you MAY be able to register to vote online: [email or web address]

## 2019-03-20 NOTE — Telephone Encounter (Signed)
**Note De-Identified Omeara Obfuscation** Will need to follow up next day

## 2019-03-20 NOTE — Progress Notes (Addendum)
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 Rolin presents mostly for an evaluation of an adverse reaction to the influenza vaccine.  The reaction occurred 10 to 15 years ago and did occur for 3 years in a row before they made the connection.  Although has been diagnosed as serum sickness, she never had any lymphadenopathy or rash consistent with serum sickness.  Although it is impossible to definitively make the connection between her symptoms and the vaccine, from a time course perspective it could make sense.  Regardless, we did do skin testing to the influenza vaccine today and then did a graded challenge.  She tolerated this without adverse event.  She is going to keep in touch with us regarding how she does after the visit today.  She also has a history of intermittent asthma and a spirometry today was normal.  We will continue with albuterol as needed.  We are to get some blood work to look for a Dillard'sCayenne pepper allergy.  Notably, she does tolerate black pepper as well as Bell peppers without a problem.  For her rhinitis, she is controlled with over-the-counter antihistamines and she was not interested in examining this further today.   Plan/Recommendations:   1. Intermittent asthma, uncomplicated - Lung testing looked great today. - There does not appear to be a need for a controller medication. - Continue with albuterol 2-4 puffs every 4-6 hours as needed.   2. Concern for flu vaccine reaction - Testing to the flu vaccine was negative. - We did give you 10% of the flu vaccine dose followed by 90% of the dose. - You tolerated these without adverse event.  - Be in touch with us over the next couple of weeks to let us know how you do.  3. Food allergy  (cayenne pepper) - Labs sent to look for a cayenne pepper allergy. - We are also sending a serum tryptase to look for a mast cell disease, which I feel is fairly unlikely. - EpiPen training deferred, pending lab results.   4. Chronic rhinitis - Continue with Alavert daily as needed. - Add on fluticasone nasal spray one spray per nostril daily to help with the congestion. - We can certainly consider allergy testing in the future if needed.  5. Return in about 3 months (around 06/20/2019). This can be an in-person, a virtual Webex or a telephone follow up visit.    Subjective:   Lisl Redd Reisig is a 58 y.o. female presenting today for evaluation of  Chief Complaint  Patient presents with  . Allergic Reaction    Elwyn Redd Dorvil has a history of the following: Patient Active Problem List   Diagnosis Date Noted  . Peripheral edema 03/22/2016  . Essential hypertension 03/22/2016  . Morbid obesity (HCC) 03/22/2016  . Snoring 03/22/2016  . Family history of abdominal aortic aneurysm 03/22/2016    History obtained from: chart review and patient.  Aleyza Redd Delucchi was referred by Lorenda IshiharaVaradarajan, Rupashree, MD.     Metro Kunghmedta is a 58 y.o. female presenting for an evaluation of vaccination reactions and asthma.  She wants to be "tested for serum sickness". She had throat closure and vomiting and dehydration with the flu shot. She is also interested in evaluating for food allergies. **Note De-Identified [place] Obfuscation** She tells me that this was several years ago, around 10-15 years ago. She was taking it every year. 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 Over the last few years, she was getting sicker and sicker after she took it. The last time that she had it, her PCP almost put her into the hospital. She was having nausea and vomiting for close to two weeks. She never put it down as an allergen but has just avoided taking the flu vaccine. With the COVID coming up, she was interested in getting this looked  into. She talked to her PCP and she recommended that she go for an allergy evaluation. She does not recall whether she had enlarged lymph nodes. She did not have hives with the flu vaccine. She does not remember having any itching at all. This occurred 3-4 years in a row. It did take a few days to show up, close to two weeks after the vaccination.   She has never had a problem with any other medication, including antibiotics. She has had other vaccinations without a problem. She is up to date on her Tetanus vaccination. She is fine with this vaccination and has never had problems with other vaccinations to her knowledge.   Of note, she does tolerate eggs without a problem. She does report that she has chronic stuffiness over the entire year. She is unsure whether she is having a reaction to something else. She does tolerate gelatin without a problem.  She tolerates all of the other food allergens without adverse event.  Allergic Rhinitis Symptom History: She has had seasonal allergies for her entire life. She is using Claritin or Alavert. She also has Sudafed. She does use nose sprays in the past, but she has not had a prescription. She has not had sinus infections in years. Typically she stays fairly healthy.   Food Allergy Symptom History: She was doing a Merchandiser, retail at one point and drank water with lemon and cayenne pepper. She had rhives over her entire body and needed steroids. She also had another episode where she broke out with hives at a [place] with cayenne pepper. She does tolerate green peppers and black pepper. This is only cayenne pepper.    Asthma Symptom History: She does have some mild asthma, mostly with exposure to cats and bronchitis episodes. She does have an inhaler but it has been years. She uses an expired one.   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 She is based off of [employer]. 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 She is never around any actual people, as these are all done over the telephone.   She is on spironolactone which was started for hair loss. This was started for her dermatologist. This does not seem to help with her hair, but it is helping with lower extremity edema.    Otherwise, there is no history of other atopic diseases, including stinging insect allergies, eczema, urticaria or contact dermatitis. There is no significant infectious history. Vaccinations are up to date.    Past Medical History: Patient Active Problem List   Diagnosis Date Noted  . Peripheral edema 03/22/2016  . Essential hypertension 03/22/2016  . Morbid obesity (HCC) 03/22/2016  . Snoring 03/22/2016  . Family history of abdominal aortic aneurysm 03/22/2016 **Note De-Identified [place] Obfuscation** Medication List:  Allergies as of 03/20/2019   No Known Allergies     Medication List       Accurate as of March 20, 2019 12:49 PM. If you have any questions, ask your nurse or doctor.        ALAVERT PO Take by mouth.   levothyroxine 25 MCG tablet Commonly known as: SYNTHROID Take 25 mcg by mouth daily before breakfast.   spironolactone 25 MG tablet Commonly known as: ALDACTONE Take 25 mg by mouth 2 (two) times daily. (Taking for hair)   SUDAFED 12 HOUR PO Take by mouth.       Birth History: non-contributory  Developmental History: non-contributory  Past Surgical History: Past Surgical History:  Procedure Laterality Date  . CESAREAN SECTION       Family History: Family History  Problem Relation Age of Onset  . Hypertension Mother   . Breast cancer Mother        hx of 26  . Aortic aneurysm Father        abdominal  . Breast cancer Sister   . Aortic aneurysm Brother        abdominal  . Healthy Brother   . Healthy Brother      Social History: Qamar lives at home with her family.  They live in a house that  is 58 years old.  There is laminate and carpet throughout the main living areas.  They have carpet in the bedroom.  They have gas, electric, and heat pump for heating purposes.  They have a heat pump and central cooling for cooling purposes.  There are no animals inside or outside of the home.  She does have dust mite covers on her bed, but not her pillow.  There is no tobacco exposure.  She does not live near an industrial area.  She does not use HEPA filters.   Review of Systems  Constitutional: Negative.  Negative for chills, fever, malaise/fatigue and weight loss.  HENT: Negative.  Negative for congestion, ear discharge and ear pain.   Eyes: Negative for pain, discharge and redness.  Respiratory: Negative for cough, sputum production, shortness of breath and wheezing.   Cardiovascular: Negative.  Negative for chest pain and palpitations.  Gastrointestinal: Negative for abdominal pain, diarrhea, heartburn, nausea and vomiting.  Skin: Positive for rash.  Neurological: Negative for dizziness and headaches.  Endo/Heme/Allergies: Positive for environmental allergies. Does not bruise/bleed easily.       Objective:   Blood pressure 110/86, pulse 93, temperature (!) 97.4 F (36.3 C), temperature source Temporal, resp. rate 18, height 5\' 3"  (1.6 m), weight 238 lb (108 kg), last menstrual period 05/13/2011, SpO2 100 %. Body mass index is 42.16 kg/m.   Physical Exam: Physical Exam  Constitutional: She is oriented to person, place, and time. She appears well-developed and well-nourished.  HENT:  Head: Normocephalic and atraumatic.  Right Ear: External ear normal.  Left Ear: External ear normal.  Nose: Nose normal.  Mouth/Throat: Oropharynx is clear and moist.  Eyes: Conjunctivae are normal.  Neck: Normal range of motion. Neck supple.  Cardiovascular: Normal rate and regular rhythm.  No murmur heard. Respiratory: Effort normal and breath sounds normal.  Lungs clear to auscultation   Musculoskeletal: Normal range of motion.  Neurological: She is alert and oriented to person, place, and time.  Skin: Skin is warm and dry.  Psychiatric: She has a normal mood and affect. Her behavior is normal. Judgment and thought content normal.    Please **Note De-Identified [place] Obfuscation** note: physical exam and testing was performed by Gareth Morgan, FNP.    Diagnostic studies: Percutaneous skin prick testing was negative to influnza vaccine with adequate cotnrols. Labs ordered include: Cayenne pepper IgE  Procedure note: Open graded IM influenza vaccine challenge: The patient was able to tolerate the challenge today without adverse signs or symptoms. Vital signs were stable throughout the challenge and observation period. She received two doses separated by 30 minutes, each of which was separated by vitals and a brief physical exam. She received the following doses: skin prick, 1/10th dose, and 9/10 dose.  She was monitored for 60 minutes following the last dose.   The patient had negative skin prick tests to influenza vaccine and was able to tolerate the open graded challenge today without adverse signs or symptoms. Therefore, she has the same risk of systemic reaction associated with influenza vaccine as the general population.  Spirometry: results normal (FEV1: 2.01%, FVC: 2.54%, FEV1/FVC: 0.79%).    Spirometry consistent with normal pattern.    Food Adult Perc - 03/20/19 0900    Time Antigen Placed  0930    Allergen Manufacturer  Other   GlaxoSmithKline   Location  Arm    Number of allergen test  3    Panel 2  Select     Control-buffer 50% Glycerol  Negative    Control-Histamine 1 mg/ml  3+    6. Other  Negative   influenza    Oral Challenge - 03/20/19 0900    Challenge Food/Drug  Influenza    Lot #  if Applicable  [id number]    Food/Drug provided by  Influenza    BP  110/86    Pulse  93    Respirations  18    Lungs  clear    Skin  clear    Mouth  clear    Time  1020    Dose  0.1 mL    BP  112/80     Pulse  76    Respirations  16    Lungs  clear    Skin  clear    Mouth  clear    Time  1050    Dose  0.4 mL    BP  102/86    Pulse  76    Respirations  16    Lungs  Clear    Skin  clear    Mouth  clear    Comments  clear    Time  1150    Dose  observation    BP  122/78    Pulse  80    Respirations  16    Lungs  clear    Skin  clear    Mouth  clear      Thank you for the opportunity to care for this patient.  Please do not hesitate to contact me with questions.  Gareth Morgan, FNP [facility]    Salvatore Marvel, MD Allergy and [facility]

## 2019-03-21 MED ORDER — ALBUTEROL SULFATE HFA 108 (90 BASE) MCG/ACT IN AERS: 2.0000 | INHALATION_SPRAY | Freq: Four times a day (QID) | RESPIRATORY_TRACT | 2 refills | Status: DC | PRN

## 2019-03-21 MED ORDER — FLUTICASONE PROPIONATE 50 MCG/ACT NA SUSP: 1.0000 | Freq: Every day | NASAL | 5 refills | Status: DC

## 2019-03-21 NOTE — Telephone Encounter (Signed)
**Note De-Identified [place] Obfuscation** Patient is doing good just having seasonal allergies and took current regimens and is stable. No c/o problems with flu shot and is only requesting refills on current medications. Will send in medications to [facility].

## 2019-03-21 NOTE — Telephone Encounter (Signed)
**Note De-identified [place] Obfuscation** Dr. Gallagher FYI 

## 2019-03-21 NOTE — Addendum Note (Signed)
**Note De-Identified [place] Obfuscation** Addended by: Valere Dross on: 03/21/2019 09:14 AM   Modules accepted: Orders

## 2019-03-21 NOTE — Telephone Encounter (Signed)
**Note De-Identified [place] Obfuscation** Thank you, sir!  I appreciate you reaching out.  Salvatore Marvel, MD Allergy and [facility]

## 2019-03-22 LAB — TRYPTASE: Tryptase: 7.1 ug/L (ref 2.2–13.2)

## 2019-03-23 LAB — ALLERGEN,CHILI PEPPER,RF279: F279-IgE Chili Pepper: <0.1 kU/L

## 2019-05-02 HISTORY — PX: BREAST BIOPSY: SHX20

## 2019-07-30 ENCOUNTER — Other Ambulatory Visit: Payer: Self-pay | Admitting: Allergy & Immunology

## 2020-01-07 ENCOUNTER — Other Ambulatory Visit: Payer: Self-pay | Admitting: Internal Medicine

## 2020-01-07 ENCOUNTER — Ambulatory Visit
Admission: RE | Admit: 2020-01-07 | Discharge: 2020-01-07 | Disposition: A | Discharge status: Home / Self Care | Payer: Federal, State, Local not specified - PPO | Source: Ambulatory Visit | Attending: Internal Medicine | Admitting: Internal Medicine

## 2020-01-07 ENCOUNTER — Other Ambulatory Visit: Payer: Self-pay

## 2020-01-07 DIAGNOSIS — Z1231 Encounter for screening mammogram for malignant neoplasm of breast: Secondary | ICD-10-CM | POA: Diagnosis not present

## 2020-01-08 DIAGNOSIS — H524 Presbyopia: Secondary | ICD-10-CM | POA: Diagnosis not present

## 2020-01-08 DIAGNOSIS — H2513 Age-related nuclear cataract, bilateral: Secondary | ICD-10-CM | POA: Diagnosis not present

## 2020-01-12 ENCOUNTER — Other Ambulatory Visit: Payer: Self-pay | Admitting: Internal Medicine

## 2020-01-12 DIAGNOSIS — R928 Other abnormal and inconclusive findings on diagnostic imaging of breast: Secondary | ICD-10-CM

## 2020-01-22 ENCOUNTER — Ambulatory Visit
Admission: RE | Admit: 2020-01-22 | Discharge: 2020-01-22 | Disposition: A | Discharge status: Home / Self Care | Payer: Federal, State, Local not specified - PPO | Source: Ambulatory Visit | Attending: Internal Medicine | Admitting: Internal Medicine

## 2020-01-22 ENCOUNTER — Other Ambulatory Visit: Payer: Self-pay

## 2020-01-22 ENCOUNTER — Other Ambulatory Visit: Payer: Self-pay | Admitting: Internal Medicine

## 2020-01-22 DIAGNOSIS — R928 Other abnormal and inconclusive findings on diagnostic imaging of breast: Secondary | ICD-10-CM

## 2020-01-22 DIAGNOSIS — R922 Inconclusive mammogram: Secondary | ICD-10-CM | POA: Diagnosis not present

## 2020-01-22 DIAGNOSIS — N6310 Unspecified lump in the right breast, unspecified quadrant: Secondary | ICD-10-CM

## 2020-01-22 DIAGNOSIS — N6489 Other specified disorders of breast: Secondary | ICD-10-CM | POA: Diagnosis not present

## 2020-01-30 ENCOUNTER — Ambulatory Visit
Admission: RE | Admit: 2020-01-30 | Discharge: 2020-01-30 | Disposition: A | Discharge status: Home / Self Care | Payer: Federal, State, Local not specified - PPO | Source: Ambulatory Visit | Attending: Internal Medicine | Admitting: Internal Medicine

## 2020-01-30 ENCOUNTER — Other Ambulatory Visit: Payer: Self-pay

## 2020-01-30 DIAGNOSIS — N6311 Unspecified lump in the right breast, upper outer quadrant: Secondary | ICD-10-CM | POA: Diagnosis not present

## 2020-01-30 DIAGNOSIS — N6011 Diffuse cystic mastopathy of right breast: Secondary | ICD-10-CM | POA: Diagnosis not present

## 2020-01-30 DIAGNOSIS — N6310 Unspecified lump in the right breast, unspecified quadrant: Secondary | ICD-10-CM

## 2020-03-18 DIAGNOSIS — E785 Hyperlipidemia, unspecified: Secondary | ICD-10-CM | POA: Diagnosis not present

## 2020-03-18 DIAGNOSIS — Z23 Encounter for immunization: Secondary | ICD-10-CM | POA: Diagnosis not present

## 2020-03-18 DIAGNOSIS — Z0000 Encounter for general adult medical examination without abnormal findings: Secondary | ICD-10-CM | POA: Diagnosis not present

## 2020-09-14 DIAGNOSIS — Z6841 Body Mass Index (BMI) 40.0 and over, adult: Secondary | ICD-10-CM | POA: Diagnosis not present

## 2020-09-14 DIAGNOSIS — Z124 Encounter for screening for malignant neoplasm of cervix: Secondary | ICD-10-CM | POA: Diagnosis not present

## 2020-09-14 DIAGNOSIS — E785 Hyperlipidemia, unspecified: Secondary | ICD-10-CM | POA: Diagnosis not present

## 2021-01-25 DIAGNOSIS — M19032 Primary osteoarthritis, left wrist: Secondary | ICD-10-CM | POA: Diagnosis not present

## 2021-01-25 DIAGNOSIS — M1812 Unilateral primary osteoarthritis of first carpometacarpal joint, left hand: Secondary | ICD-10-CM | POA: Diagnosis not present

## 2021-07-15 IMAGING — US US  BREAST BX W/ LOC DEV 1ST LESION IMG BX SPEC US GUIDE*R*
1 series · 11 of 11 positions shown · non-contrast
Comparison: Previous exam(s).
COMPARISON: Previous exam(s).

Addendum:
CLINICAL DATA: Patient presents for ultrasound-guided core biopsy
of mass in the RIGHT breast.

EXAM:
ULTRASOUND GUIDED RIGHT BREAST CORE NEEDLE BIOPSY

[Series 1: us breast bx w/ loc dev 1st lesion img bx spec us  · 0.07mm/px · 11 of 11 slices shown]
[im 1/11]
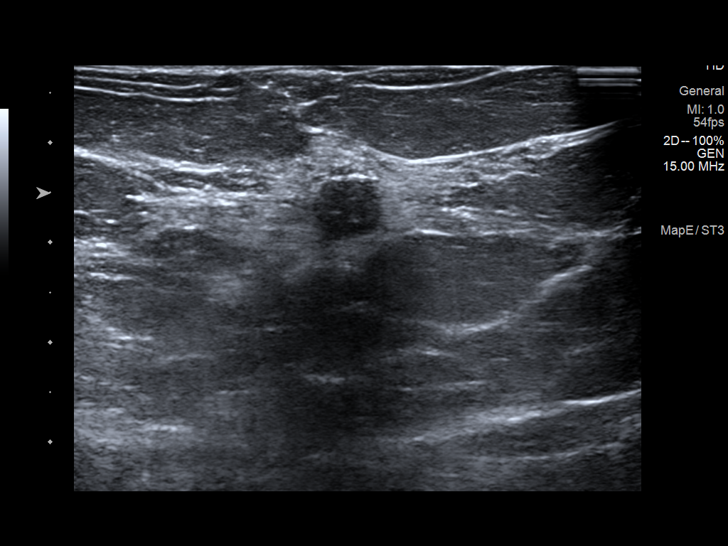
[im 2/11]
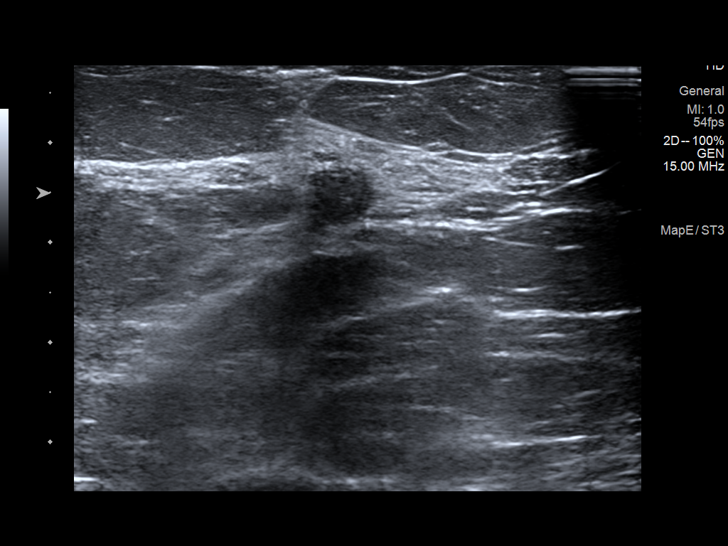
[im 3/11]
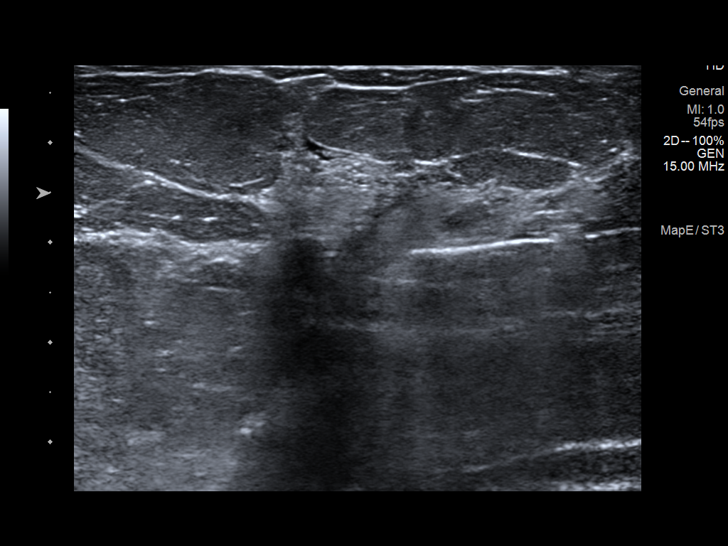
[im 4/11]
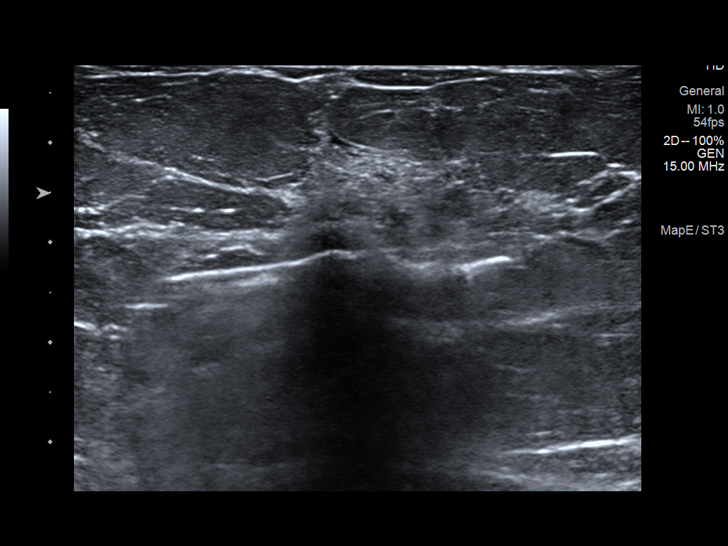
[im 5/11]
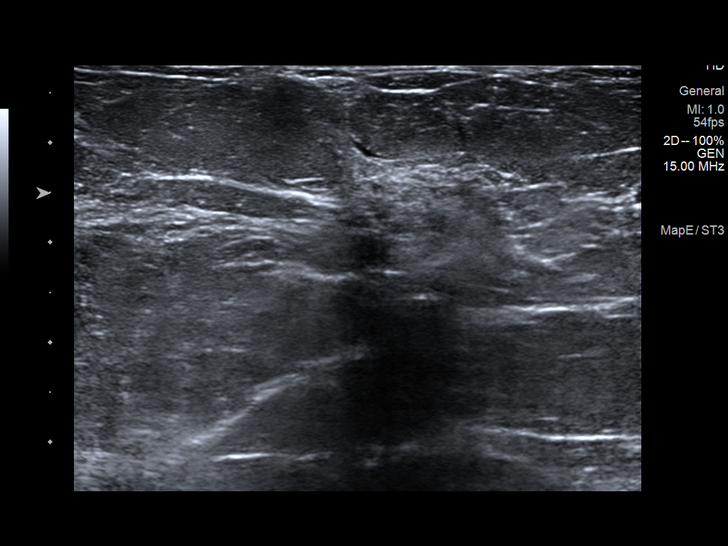
[im 6/11]
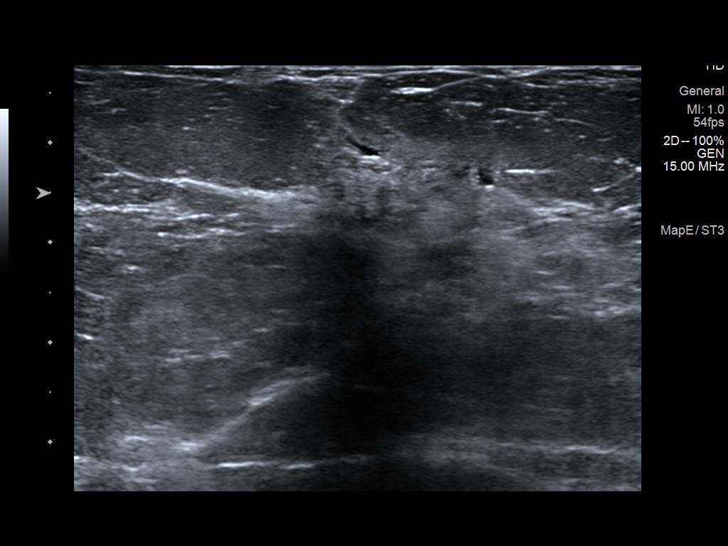
[im 7/11]
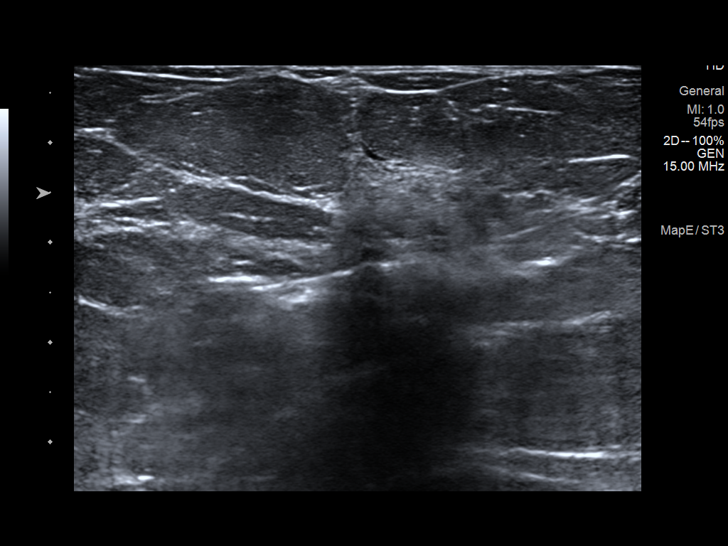
[im 8/11]
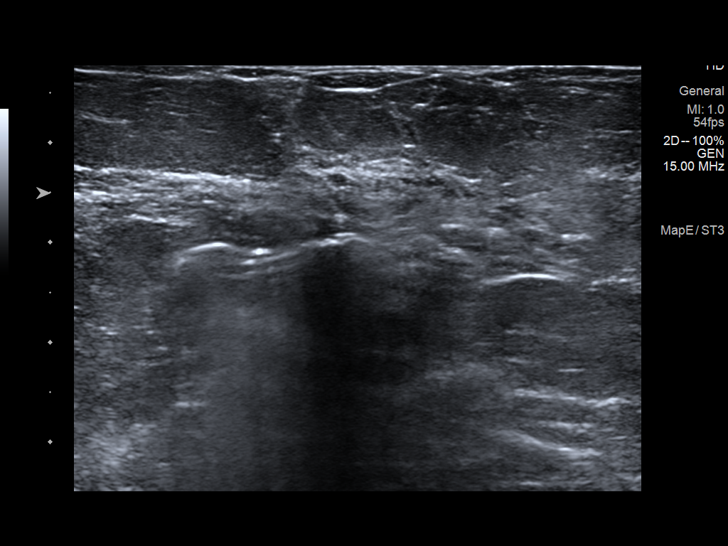
[im 9/11]
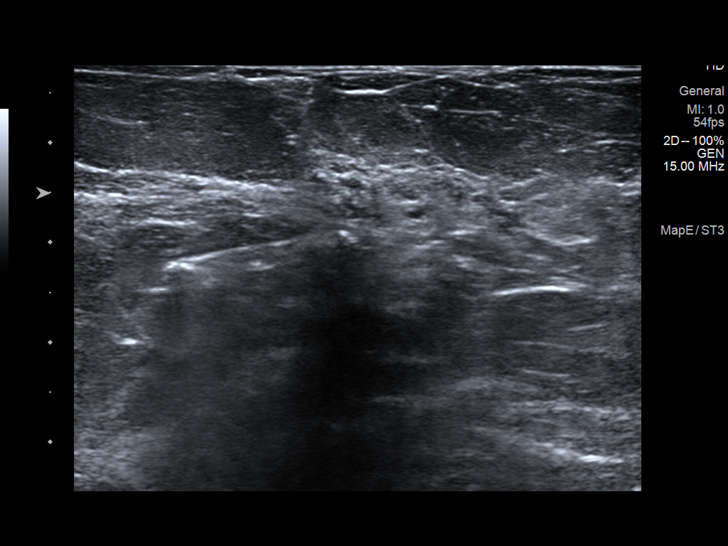
[im 10/11]
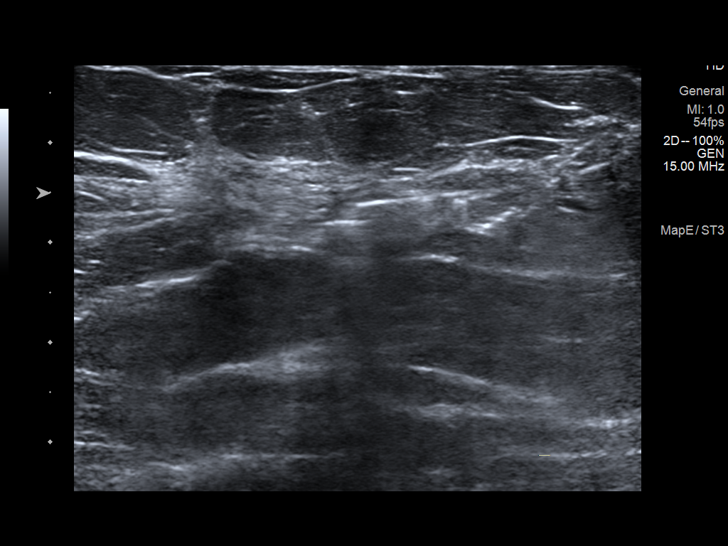
[im 11/11]
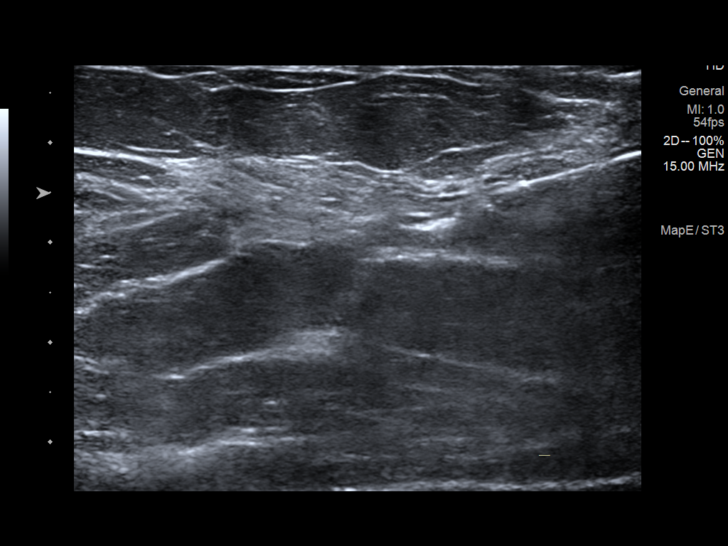

[11 of 11 positions shown; findings below may reference images not displayed]



Lesion quadrant: UPPER-OUTER QUADRANT RIGHT breast

Using sterile technique and 1% Lidocaine as local anesthetic, under
direct ultrasound visualization, a 12 gauge coaxial Sorin
device was used to perform biopsy of mass in the 11:30 o'clock
location of the RIGHT breast using a MEDIAL to LATERAL approach. At
the conclusion of the procedure ribbon shaped tissue marker clip was
deployed into the biopsy cavity. Follow up 2 view mammogram was
performed and dictated separately.
IMPRESSION: Ultrasound guided biopsy of RIGHT breast mass. No apparent
complications.

ADDENDUM:
Pathology revealed FIBROCYSTIC CHANGE WITH CALCIFICATIONS of the
RIGHT breast, 11:30 o'clock mass, right upper outer quadrant. This
was found to be concordant by Dr. Puciata Kovalenka.

Pathology results were discussed with the patient by telephone. The
patient reported doing well after the biopsy with tenderness at the
site. Post biopsy instructions and care were reviewed and questions
were answered. The patient was encouraged to call The [REDACTED]

The patient was instructed to return for annual screening
mammography and informed a reminder notice would be sent regarding
this appointment.

Pathology results reported by Boluwaji Uriel RN on 02/02/2020.



Lesion quadrant: UPPER-OUTER QUADRANT RIGHT breast

Using sterile technique and 1% Lidocaine as local anesthetic, under
direct ultrasound visualization, a 12 gauge coaxial Sorin
device was used to perform biopsy of mass in the 11:30 o'clock
location of the RIGHT breast using a MEDIAL to LATERAL approach. At
the conclusion of the procedure ribbon shaped tissue marker clip was
deployed into the biopsy cavity. Follow up 2 view mammogram was
performed and dictated separately.
IMPRESSION: Ultrasound guided biopsy of RIGHT breast mass. No apparent
complications.

## 2021-09-13 ENCOUNTER — Other Ambulatory Visit: Payer: Self-pay | Admitting: Internal Medicine

## 2021-09-13 DIAGNOSIS — Z1231 Encounter for screening mammogram for malignant neoplasm of breast: Secondary | ICD-10-CM

## 2021-09-15 ENCOUNTER — Ambulatory Visit
Admission: RE | Admit: 2021-09-15 | Discharge: 2021-09-15 | Disposition: A | Discharge status: Home / Self Care | Payer: Federal, State, Local not specified - PPO | Source: Ambulatory Visit | Attending: Internal Medicine | Admitting: Internal Medicine

## 2021-09-15 DIAGNOSIS — Z1231 Encounter for screening mammogram for malignant neoplasm of breast: Secondary | ICD-10-CM

## 2021-09-20 DIAGNOSIS — I5030 Unspecified diastolic (congestive) heart failure: Secondary | ICD-10-CM | POA: Diagnosis not present

## 2021-09-20 DIAGNOSIS — E039 Hypothyroidism, unspecified: Secondary | ICD-10-CM | POA: Diagnosis not present

## 2021-09-20 DIAGNOSIS — Z0000 Encounter for general adult medical examination without abnormal findings: Secondary | ICD-10-CM | POA: Diagnosis not present

## 2021-09-20 DIAGNOSIS — E785 Hyperlipidemia, unspecified: Secondary | ICD-10-CM | POA: Diagnosis not present

## 2021-10-07 ENCOUNTER — Ambulatory Visit
Admission: RE | Admit: 2021-10-07 | Discharge: 2021-10-07 | Disposition: A | Discharge status: Home / Self Care | Payer: Federal, State, Local not specified - PPO | Source: Ambulatory Visit | Attending: Internal Medicine | Admitting: Internal Medicine

## 2021-10-07 ENCOUNTER — Other Ambulatory Visit: Payer: Self-pay | Admitting: Internal Medicine

## 2021-10-07 DIAGNOSIS — M25551 Pain in right hip: Secondary | ICD-10-CM

## 2021-10-25 DIAGNOSIS — L821 Other seborrheic keratosis: Secondary | ICD-10-CM | POA: Diagnosis not present

## 2021-10-25 DIAGNOSIS — L820 Inflamed seborrheic keratosis: Secondary | ICD-10-CM | POA: Diagnosis not present

## 2021-10-25 DIAGNOSIS — L813 Cafe au lait spots: Secondary | ICD-10-CM | POA: Diagnosis not present

## 2021-11-17 DIAGNOSIS — N6001 Solitary cyst of right breast: Secondary | ICD-10-CM | POA: Diagnosis not present

## 2021-11-17 DIAGNOSIS — E039 Hypothyroidism, unspecified: Secondary | ICD-10-CM | POA: Diagnosis not present

## 2021-12-01 DIAGNOSIS — Z1211 Encounter for screening for malignant neoplasm of colon: Secondary | ICD-10-CM | POA: Diagnosis not present

## 2021-12-01 DIAGNOSIS — R194 Change in bowel habit: Secondary | ICD-10-CM | POA: Diagnosis not present

## 2021-12-01 DIAGNOSIS — E782 Mixed hyperlipidemia: Secondary | ICD-10-CM | POA: Diagnosis not present

## 2022-03-14 DIAGNOSIS — Z23 Encounter for immunization: Secondary | ICD-10-CM | POA: Diagnosis not present

## 2022-03-14 DIAGNOSIS — E785 Hyperlipidemia, unspecified: Secondary | ICD-10-CM | POA: Diagnosis not present

## 2022-03-14 DIAGNOSIS — M25551 Pain in right hip: Secondary | ICD-10-CM | POA: Diagnosis not present

## 2022-09-05 DIAGNOSIS — G250 Essential tremor: Secondary | ICD-10-CM | POA: Diagnosis not present

## 2022-09-13 DIAGNOSIS — M9902 Segmental and somatic dysfunction of thoracic region: Secondary | ICD-10-CM | POA: Diagnosis not present

## 2022-09-13 DIAGNOSIS — M25651 Stiffness of right hip, not elsewhere classified: Secondary | ICD-10-CM | POA: Diagnosis not present

## 2022-09-13 DIAGNOSIS — M7918 Myalgia, other site: Secondary | ICD-10-CM | POA: Diagnosis not present

## 2022-09-13 DIAGNOSIS — R251 Tremor, unspecified: Secondary | ICD-10-CM | POA: Diagnosis not present

## 2022-09-13 DIAGNOSIS — M9901 Segmental and somatic dysfunction of cervical region: Secondary | ICD-10-CM | POA: Diagnosis not present

## 2022-09-13 DIAGNOSIS — M25652 Stiffness of left hip, not elsewhere classified: Secondary | ICD-10-CM | POA: Diagnosis not present

## 2022-09-13 DIAGNOSIS — M9903 Segmental and somatic dysfunction of lumbar region: Secondary | ICD-10-CM | POA: Diagnosis not present

## 2022-09-20 DIAGNOSIS — M25651 Stiffness of right hip, not elsewhere classified: Secondary | ICD-10-CM | POA: Diagnosis not present

## 2022-09-20 DIAGNOSIS — R251 Tremor, unspecified: Secondary | ICD-10-CM | POA: Diagnosis not present

## 2022-09-20 DIAGNOSIS — M25652 Stiffness of left hip, not elsewhere classified: Secondary | ICD-10-CM | POA: Diagnosis not present

## 2022-09-20 DIAGNOSIS — M9902 Segmental and somatic dysfunction of thoracic region: Secondary | ICD-10-CM | POA: Diagnosis not present

## 2022-09-20 DIAGNOSIS — M7918 Myalgia, other site: Secondary | ICD-10-CM | POA: Diagnosis not present

## 2022-09-20 DIAGNOSIS — M9903 Segmental and somatic dysfunction of lumbar region: Secondary | ICD-10-CM | POA: Diagnosis not present

## 2022-09-20 DIAGNOSIS — M9901 Segmental and somatic dysfunction of cervical region: Secondary | ICD-10-CM | POA: Diagnosis not present

## 2022-10-04 DIAGNOSIS — R251 Tremor, unspecified: Secondary | ICD-10-CM | POA: Diagnosis not present

## 2022-10-04 DIAGNOSIS — M9902 Segmental and somatic dysfunction of thoracic region: Secondary | ICD-10-CM | POA: Diagnosis not present

## 2022-10-04 DIAGNOSIS — M9903 Segmental and somatic dysfunction of lumbar region: Secondary | ICD-10-CM | POA: Diagnosis not present

## 2022-10-04 DIAGNOSIS — M25652 Stiffness of left hip, not elsewhere classified: Secondary | ICD-10-CM | POA: Diagnosis not present

## 2022-10-04 DIAGNOSIS — M7918 Myalgia, other site: Secondary | ICD-10-CM | POA: Diagnosis not present

## 2022-10-04 DIAGNOSIS — M9901 Segmental and somatic dysfunction of cervical region: Secondary | ICD-10-CM | POA: Diagnosis not present

## 2022-10-04 DIAGNOSIS — M25651 Stiffness of right hip, not elsewhere classified: Secondary | ICD-10-CM | POA: Diagnosis not present

## 2022-10-11 DIAGNOSIS — M25652 Stiffness of left hip, not elsewhere classified: Secondary | ICD-10-CM | POA: Diagnosis not present

## 2022-10-11 DIAGNOSIS — M9901 Segmental and somatic dysfunction of cervical region: Secondary | ICD-10-CM | POA: Diagnosis not present

## 2022-10-11 DIAGNOSIS — M7918 Myalgia, other site: Secondary | ICD-10-CM | POA: Diagnosis not present

## 2022-10-11 DIAGNOSIS — M9902 Segmental and somatic dysfunction of thoracic region: Secondary | ICD-10-CM | POA: Diagnosis not present

## 2022-10-11 DIAGNOSIS — M25651 Stiffness of right hip, not elsewhere classified: Secondary | ICD-10-CM | POA: Diagnosis not present

## 2022-10-11 DIAGNOSIS — M9903 Segmental and somatic dysfunction of lumbar region: Secondary | ICD-10-CM | POA: Diagnosis not present

## 2022-10-11 DIAGNOSIS — R251 Tremor, unspecified: Secondary | ICD-10-CM | POA: Diagnosis not present

## 2022-10-17 DIAGNOSIS — E785 Hyperlipidemia, unspecified: Secondary | ICD-10-CM | POA: Diagnosis not present

## 2022-10-17 DIAGNOSIS — E559 Vitamin D deficiency, unspecified: Secondary | ICD-10-CM | POA: Diagnosis not present

## 2022-10-17 DIAGNOSIS — Z0000 Encounter for general adult medical examination without abnormal findings: Secondary | ICD-10-CM | POA: Diagnosis not present

## 2022-10-17 DIAGNOSIS — E039 Hypothyroidism, unspecified: Secondary | ICD-10-CM | POA: Diagnosis not present

## 2022-10-17 DIAGNOSIS — I5030 Unspecified diastolic (congestive) heart failure: Secondary | ICD-10-CM | POA: Diagnosis not present

## 2022-10-17 DIAGNOSIS — R000 Tachycardia, unspecified: Secondary | ICD-10-CM | POA: Diagnosis not present

## 2022-10-17 DIAGNOSIS — D693 Immune thrombocytopenic purpura: Secondary | ICD-10-CM | POA: Diagnosis not present

## 2022-10-24 ENCOUNTER — Other Ambulatory Visit: Payer: Self-pay | Admitting: Internal Medicine

## 2022-10-24 ENCOUNTER — Encounter: Payer: Self-pay | Admitting: *Deleted

## 2022-10-24 ENCOUNTER — Ambulatory Visit: Payer: Federal, State, Local not specified - PPO | Admitting: Neurology

## 2022-10-24 ENCOUNTER — Encounter: Payer: Self-pay | Admitting: Neurology

## 2022-10-24 VITALS — BP 126/90 | HR 96 | Ht 62.5 in | Wt 239.8 lb

## 2022-10-24 DIAGNOSIS — G20C Parkinsonism, unspecified: Secondary | ICD-10-CM

## 2022-10-24 DIAGNOSIS — Z1231 Encounter for screening mammogram for malignant neoplasm of breast: Secondary | ICD-10-CM

## 2022-10-24 NOTE — Patient Instructions (Signed)
**Note De-Identified [place] Obfuscation** I believe you have Parkinson's disease, affecting your left side and findings are on the mild side. Nevertheless, this disease does progress with time. It can affect your balance, your memory, your mood, your bowel and bladder function, your posture, balance and walking and your activities of daily living. However, there are good supportive treatments and symptomatic treatments available, so most patients have a change to a good quality life and life expectancy is not typically altered. Overall you are doing fairly well but I do want to suggest a few things today:  Remember to drink plenty of fluid at least 6 glasses (8 oz each), eat healthy meals and do not skip any meals. Try to eat protein with a every meal and eat a healthy snack such as fruit or nuts in between meals. Try to keep a regular sleep-wake schedule and try to exercise daily, particularly in the form of walking, 20-30 minutes a day, if you can.   We may consider starting a different medication. Please remember, no medication is without potential side effects and not every medication is right for every patient with PD.   Try to stay active physically and mentally. Engage in social activities in your community and with your family and try to keep up with current events by reading the newspaper or watching the news. Try to do word puzzles and you may like to do puzzles and brain games on the computer such as on [email or web address].[email or web address]   We will likely consider a brain MRI in the near future.   For now, I would like to order a so called DaT scan: This is a specialized brain scan designed to help with diagnosis of tremor disorders. It is not a definitive test for any diagnosis.  A radioactive marker gets injected and the uptake is measured in the brain and compared to normal controls and right side is compared to the left, a change in uptake can help with diagnosis of certain tremor disorders. A brain MRI on the other hand is a brain scan that helps look  at the brain structure in more detail overall and look for age-related changes, blood vessel related changes and look for stroke and volume loss which we call atrophy.   We will plan a follow up after the test and we will call you with the results.

## 2022-10-24 NOTE — Progress Notes (Signed)
**Note De-Identified Forgione Obfuscation** Subjective:    Patient ID: Bethany Carr is a 62 y.o. female.  HPI    Bethany Foley, MD, PhD [facility] [address] [address] [place], [place] [address]  Dear Dr. Chales Salmon,   I saw your patient, Bethany Carr, upon your kind request in my neurologic clinic today for initial consultation of her tremor disorder.  The patient is unaccompanied today.  As you know, Bethany Carr is a 62 year old female with an underlying medical history of allergies, thrombocytopenia, hypothyroidism, hyperlipidemia, anxiety, asthma, and obesity with a BMI of over 40, who reports a history of left-sided tremors and fine motor dyscontrol in the left upper and lower extremities for the past several months, symptoms started in October 2023 and have been plateaued for the most part.  In fact, she feels that her left leg tremor has actually improved.  I reviewed your office note from 09/05/2022.  She was started on metoprolol 25 mg daily long-acting, for concern for essential tremor.  This was recently increased to 50 mg once daily about a week ago.  She also had blood work in your office about a week ago, we will request blood test results as these are not currently available for my review today.  She does not believe that the tremor has responded to the beta-blocker.  She is not aware of any family history of tremors or Parkinson's disease.  She has been seeing a chiropractor who noticed stiffness in the left upper extremity.  She is right-handed.  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  She can work from home about 3 days a week.  She drinks caffeine in limitation, usually 1 cup of coffee.  She tries to hydrate well.  She has rare alcohol, she is a non-smoker.  She lives with her husband and currently their 43 year old daughter lives with along with her 65-month-old daughter.  They also have a son who is 53 and lives in [place].  She has not  fallen.  She has had stress, particularly with regards to her mom's health, mom is 95 and lives in [place].  She has had more anxiety.  Patient uses Xanax as needed.  She denies any significant snoring, may have occasional snoring, feels that she sleeps fairly well currently.    Her Past Medical History Is Significant For: Past Medical History:  Diagnosis Date   Anxiety    Asthma    Breast cyst, right    Dyslipidemia    Estrogen deficiency    History of ITP    during pregnancy   Hypothyroidism    Morbid obesity (HCC)    Right hip pain    pt states back pain   Seasonal allergic rhinitis     Her Past Surgical History Is Significant For: Past Surgical History:  Procedure Laterality Date   BREAST BIOPSY Right 2021   CESAREAN SECTION      Her Family History Is Significant For: Family History  Problem Relation Age of Onset   Hypertension Mother    Breast cancer Mother        hx of 55   Aortic aneurysm Father        abdominal   Breast cancer Sister    Aortic aneurysm Brother        abdominal   Healthy Brother    Healthy Brother     Her Social History Is Significant For: Social History   Socioeconomic **Note De-Identified Villela Obfuscation** History   Marital status: Married    Spouse name: Not on file   Number of children: Not on file   Years of education: Not on file   Highest education level: Not on file  Occupational History   Not on file  Tobacco Use   Smoking status: Never   Smokeless tobacco: Never  Vaping Use   Vaping Use: Never used  Substance and Sexual Activity   Alcohol use: No   Drug use: No   Sexual activity: Yes  Other Topics Concern   Not on file  Social History Narrative   Caffiene 2 cups daily   Married   2 kids   Education some college   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   atorvastatin (LIPITOR) 40 MG tablet Take 40 mg by mouth daily.   ibuprofen (ADVIL) 200 MG tablet Take 200 mg by mouth every 6 (six) hours as needed.   Loratadine (ALAVERT PO) Take 1 tablet by mouth daily at 6 (six) AM.   meloxicam (MOBIC) 15 MG tablet Take 15 mg by mouth daily.   metoprolol succinate (TOPROL-XL) 50 MG 24 hr tablet Take 50 mg by mouth daily.   Multiple Vitamin (MULTIVITAMIN ADULT PO) MVI   scopolamine (TRANSDERM-SCOP) 1 MG/3DAYS Place 1 patch onto the skin every 3 (three) days.   No facility-administered encounter medications on file as of 10/24/2022.  :   Review of Systems:  Out of a complete 14 point review of systems, all are reviewed and negative with the exception of these symptoms as listed below:  Review of Systems  Neurological:        L sided tremor since 01/2022, metoprolol has not helped.  Leg is better,  wakes her in middle of night.  Tremor sheet done.     Objective:  Neurological Exam  Physical Exam Physical Examination:   Vitals:   10/24/22 1507  BP: (!) 126/90  Pulse: 96  SpO2: 97%    General Examination: The patient is a very pleasant 62 y.o. female in no acute distress. She appears well-developed and well-nourished and well groomed.   HEENT: Normocephalic, atraumatic, pupils are equal, round and reactive to light, extraocular tracking is good without limitation to gaze excursion or nystagmus noted. Hearing is grossly intact. Face is symmetric with mild facial masking, no significant speech impairment, in particular, no dysarthria, no significant hypophonia.  No voice tremor.  Neck with mild rigidity noted, good range of motion, no carotid bruits.  Airway examination reveals mild mouth dryness, tongue protrudes  centrally and palate elevates symmetrically.  Good tongue movements.   Chest: Clear to auscultation without wheezing, rhonchi or crackles noted.  Heart: S1+S2+0, regular and normal without murmurs, rubs or gallops noted.   Abdomen: Soft, non-tender and non-distended.  Extremities: There is significant puffiness noted in the distal lower extremities bilaterally, particularly around the ankles but essentially nonpitting.  She reports that these are chronic changes.  She reports that she is to see a cardiologist. **Note De-Identified Marsland Obfuscation** Skin: Warm and dry without trophic changes noted.   Musculoskeletal: exam reveals no obvious joint deformities.   Neurologically:  Mental status: The patient is awake, alert and oriented in all 4 spheres. Her immediate and remote memory, attention, language skills and fund of knowledge are appropriate. There is no evidence of aphasia, agnosia, apraxia or anomia. Speech is clear with normal prosody and enunciation. Thought process is linear. Mood is normal and affect is normal.  Cranial nerves II - XII are as described above under HEENT exam.  Motor exam: Normal bulk, strength and tone is noted, with the exception of mild increase in the left upper extremity tone, not so much cogwheeling, particularly notable increase in tone with contralateral activation.  There is a fairly consistent, mostly mild resting tremor in the left upper extremity, no significant resting tremor in the lower extremities noted, no right hand tremor noted.   On 10/24/2022: On Archimedes spiral drawing she has no significant trembling with her right hand, mild insecurity with her left hand including slight trembling, handwriting with the right hand is legible, not particularly tremulous, not particularly micrographic.  She has a slight left upper extremity postural tremor and action tremor, no intention tremor.   Fine motor skills and coordination: She has mild impairment with finger taps and hand movements and rapid  alternating patting with the left upper extremity, slight impairment with foot taps on the left, normal findings on the right.   Cerebellar testing: No dysmetria or intention tremor. There is no truncal or gait ataxia.  No difficulty with finger-to-nose. Reflexes are 1+ in the upper extremities, absent in the knees, trace in the ankles, toes are downgoing bilaterally. Sensory exam: intact to light touch in the upper and lower extremities.  Gait, station and balance: She stands without difficulty, posture is age-appropriate, she walks with a fairly good pace, slightly smaller stride length, decreased arm swing on the left, no significant difficulty with turns.  No telltale shuffling.  Assessment and Plan:  In summary, Azile Redd Treese is a very pleasant 62 y.o.-year old female with an underlying medical history of allergies, thrombocytopenia, hypothyroidism, hyperlipidemia, anxiety, asthma, and obesity with a BMI of over 40, who presents for evaluation of her left sided tremor of approximately 8 months duration, symptoms dates back to October 2023.  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  I had a long discussion with the patient regarding her symptoms, her presentation and my exam findings today.  The diagnosis of parkinsonism was explained to her and we talked about additional diagnostic testing at this time.  We will consider a brain MRI in the near future, but for now, I would like to proceed with a brain DaTscan.  I explained this nuclear medicine scan to the patient at length and she was also provided with additional patient information in writing.  We talked about utilizing symptomatic treatment in the near future.  She may not need the beta-blocker as such but I have not advised her to discontinue it quite yet.  In fact, it may help her with her anxiety and her blood pressure values.  She is agreeable  to pursuing a DaTscan at this time.  We will request recent blood test results from your office, in particular, we need to ensure that she has good kidney function prior to her DaTscan.  We will plan a follow-up afterwards, we will keep her posted as to her scan results by **Note De-Identified Sills Obfuscation** phone call in the interim.  She is agreeable to holding off of any symptomatic treatment and new medications at this time.  She would be agreeable to pursuing a brain MRI as well so we have a baseline.  I answered all her questions today and she was in agreement with our plan.  This was an extended visit of over 60 minutes secondary to extensive counseling and coordination of care, reviewing existing records, and requesting additional records, explaining the difference between her MRI and the DaTscan and providing detailed written instructions. Thank you very much for allowing me to participate in the care of this nice patient. If I can be of any further assistance to you please do not hesitate to call me at [phone].  Sincerely,   Bethany Foley, MD, PhD

## 2022-10-25 ENCOUNTER — Telehealth: Payer: Self-pay | Admitting: Neurology

## 2022-10-25 DIAGNOSIS — R251 Tremor, unspecified: Secondary | ICD-10-CM | POA: Diagnosis not present

## 2022-10-25 DIAGNOSIS — M9903 Segmental and somatic dysfunction of lumbar region: Secondary | ICD-10-CM | POA: Diagnosis not present

## 2022-10-25 DIAGNOSIS — M9902 Segmental and somatic dysfunction of thoracic region: Secondary | ICD-10-CM | POA: Diagnosis not present

## 2022-10-25 DIAGNOSIS — M9901 Segmental and somatic dysfunction of cervical region: Secondary | ICD-10-CM | POA: Diagnosis not present

## 2022-10-25 NOTE — Telephone Encounter (Signed)
**Note De-Identified Stifter Obfuscation** BCBS federal no auth required sent to [facility] nuclear medicine [id number]

## 2022-10-31 ENCOUNTER — Ambulatory Visit
Admission: RE | Admit: 2022-10-31 | Discharge: 2022-10-31 | Disposition: A | Discharge status: Home / Self Care | Payer: Federal, State, Local not specified - PPO | Source: Ambulatory Visit | Attending: Internal Medicine | Admitting: Internal Medicine

## 2022-10-31 DIAGNOSIS — Z1231 Encounter for screening mammogram for malignant neoplasm of breast: Secondary | ICD-10-CM | POA: Diagnosis not present

## 2022-11-03 ENCOUNTER — Other Ambulatory Visit: Payer: Self-pay | Admitting: Internal Medicine

## 2022-11-03 DIAGNOSIS — R928 Other abnormal and inconclusive findings on diagnostic imaging of breast: Secondary | ICD-10-CM

## 2022-11-17 DIAGNOSIS — Z1211 Encounter for screening for malignant neoplasm of colon: Secondary | ICD-10-CM | POA: Diagnosis not present

## 2022-11-17 DIAGNOSIS — K5730 Diverticulosis of large intestine without perforation or abscess without bleeding: Secondary | ICD-10-CM | POA: Diagnosis not present

## 2022-11-21 ENCOUNTER — Inpatient Hospital Stay: Admission: RE | Admit: 2022-11-21 | Payer: Federal, State, Local not specified - PPO | Source: Ambulatory Visit

## 2022-11-21 ENCOUNTER — Other Ambulatory Visit: Payer: Federal, State, Local not specified - PPO

## 2022-11-30 ENCOUNTER — Ambulatory Visit (HOSPITAL_COMMUNITY)
Admission: RE | Admit: 2022-11-30 | Discharge: 2022-11-30 | Disposition: A | Discharge status: Home / Self Care | Payer: Federal, State, Local not specified - PPO | Source: Ambulatory Visit | Attending: Neurology | Admitting: Neurology

## 2022-11-30 DIAGNOSIS — G20C Parkinsonism, unspecified: Secondary | ICD-10-CM | POA: Diagnosis not present

## 2022-11-30 DIAGNOSIS — R251 Tremor, unspecified: Secondary | ICD-10-CM | POA: Diagnosis not present

## 2022-11-30 MED ORDER — POTASSIUM IODIDE (ANTIDOTE) 130 MG PO TABS
ORAL_TABLET | ORAL | Status: AC
  Filled 2022-11-30: qty 1

## 2022-11-30 MED ORDER — IOFLUPANE I 123 185 MBQ/2.5ML IV SOLN
4.8000 | Freq: Once | INTRAVENOUS | Status: AC | PRN
  Administered 2022-11-30: 4.8 via INTRAVENOUS
  Filled 2022-11-30: qty 5

## 2022-12-04 ENCOUNTER — Ambulatory Visit
Admission: RE | Admit: 2022-12-04 | Discharge: 2022-12-04 | Disposition: A | Discharge status: Home / Self Care | Payer: Federal, State, Local not specified - PPO | Source: Ambulatory Visit | Attending: Internal Medicine | Admitting: Internal Medicine

## 2022-12-04 ENCOUNTER — Other Ambulatory Visit: Payer: Federal, State, Local not specified - PPO

## 2022-12-04 DIAGNOSIS — R928 Other abnormal and inconclusive findings on diagnostic imaging of breast: Secondary | ICD-10-CM

## 2022-12-04 DIAGNOSIS — N6001 Solitary cyst of right breast: Secondary | ICD-10-CM | POA: Diagnosis not present

## 2022-12-04 DIAGNOSIS — N6002 Solitary cyst of left breast: Secondary | ICD-10-CM | POA: Diagnosis not present

## 2022-12-12 ENCOUNTER — Telehealth: Payer: Self-pay

## 2022-12-12 NOTE — Telephone Encounter (Signed)
**Note De-Identified Didonato Obfuscation** I spoke with the patient and relayed the results from Dr. Frances Furbish. She verbalized understanding of the results of the DatScan. I have scheduled a follow up for the patient to review the scan in further detail, address any questions, and discuss medication options. The appointment is scheduled for 01/15/2023. I have added the patient to the wait list for a sooner appointment, per patient request. She expressed appreciation for the call. She denied any questions at this time.

## 2022-12-12 NOTE — Telephone Encounter (Signed)
**Note De-identified Bethany Carr** -----  **Note De-Identified Bethany Carr** Message from Huston Foley sent at 12/06/2022  1:52 PM EDT ----- Please call patient (or designated party on [place]) regarding the recent nuclear medicine DaT scan result. As discussed, this is a specialized brain scan designed to aid with the diagnosis of tremor disorders, including parkinsonian disorders. A radioactive marker gets injected and the uptake is measured in the brain and compared to normal controls and right side of the brain is compared to the left side. A change in uptake can help with diagnosis of certain tremor disorders and narrow down the diagnostic possibilities. The patient's recent scan indicated abnormal (as in lower) uptake as compared to normal uptake pattern indicating an underlying parkinsonian disorder. Keep in mind, this is not a definitive test for Parkinson's disease and does not distinguish between Parkinson's disease and other, atypical parkinsonian disorders.  Please offer follow-up appointment to discuss further and possible symptomatic treatment options.

## 2022-12-19 MED ORDER — PRAMIPEXOLE DIHYDROCHLORIDE 0.125 MG PO TABS: 0.1250 mg | ORAL_TABLET | Freq: Three times a day (TID) | ORAL | 2 refills | Status: DC

## 2022-12-19 NOTE — Telephone Encounter (Signed)
**Note De-Identified Lanuza Obfuscation** I returned the patient's call.  She is asking to go ahead and start medication before her appointment on September 16 and then discuss at appt if its working.  She also mentioned that her daughter's wedding is on September 7 and due to the tremors she is not sure how she will tolerate it.  I did advise that the appointment was scheduled so that medications could be discussed in detail and this would require an appt. I told the pt I would certainly let Dr Frances Furbish know her wishes but I do not anticipate an early prescription. I would also look for her a sooner appt (none seen now but will put on wait list). The patient also asked if Dr. Frances Furbish felt like hormone therapy would be helpful.

## 2022-12-19 NOTE — Telephone Encounter (Signed)
**Note De-Identified Barlett Obfuscation** Pt have question about some medical treatment before schedule appt. Would like a call back.

## 2022-12-19 NOTE — Telephone Encounter (Signed)
**Note De-Identified Nepomuceno Obfuscation** We can go ahead and start her on a trial of low-dose pramipexole:   Mirapex (generic name: pramipexole) 0.125 mg: Take 1 pill once daily for 3 days, then 1 pill twice daily for 3 days, then 1 pill three times a day thereafter.   Common side effects reported are: Sedation, sleepiness, nausea, vomiting, and rare side effects are confusion, hallucinations, swelling in legs, and abnormal behaviors, including impulse control problems, which can manifest as excessive eating, obsessions with food or gambling, or hypersexuality.

## 2022-12-19 NOTE — Addendum Note (Signed)
**Note De-Identified Marchena Obfuscation** Addended by: Huston Foley on: 12/19/2022 06:42 PM   Modules accepted: Orders

## 2022-12-20 NOTE — Telephone Encounter (Addendum)
**Note De-Identified Lust Obfuscation** I called the patient and discussed the new medication Pramipexole for patient to try ahead of her 9/16 appt with Dr Frances Furbish. Patient was very Adult nurse. We discussed the details of the instructions and possible side effects which are noted below by Dr Frances Furbish. I also sent these through her to mychart. Pt wants to remain on wait list if possible. She verbalized understanding of the instructions and appreciation. She will call us if she has any questions, concerns, or if she has any trouble tolerating as dose increases. Also relayed to pt Dr Teofilo Pod response below to pt's question regarding HRT.

## 2022-12-20 NOTE — Telephone Encounter (Signed)
**Note De-Identified Hawkes Obfuscation** Regarding pt's question about HRT, Dr Frances Furbish says: "no specific benefit from HRT, she should discuss pros and cons with GYN".

## 2022-12-27 NOTE — Progress Notes (Unsigned)
**Note De-Identified Elling Obfuscation** No chief complaint on file.     History of Present Illness: 62 yo female with history of anxiety, hyperlipidemia, hypothyroidism, Parkinson's disease and obesity who is here today as a new consult, referred by Dr. ***, for the evaluation of ***.   Primary Care Physician: Lorenda Ishihara, MD   Past Medical History:  Diagnosis Date   Anxiety    Asthma    Breast cyst, right    Dyslipidemia    Estrogen deficiency    History of ITP    during pregnancy   Hypothyroidism    Morbid obesity (HCC)    Right hip pain    pt states back pain   Seasonal allergic rhinitis     Past Surgical History:  Procedure Laterality Date   BREAST BIOPSY Right 2021   CESAREAN SECTION      Current Outpatient Medications  Medication Sig Dispense Refill   ALPRAZolam (XANAX) 0.5 MG tablet Take 0.5 mg by mouth 2 (two) times daily as needed.     atorvastatin (LIPITOR) 40 MG tablet Take 40 mg by mouth daily.     ibuprofen (ADVIL) 200 MG tablet Take 200 mg by mouth every 6 (six) hours as needed.     Loratadine (ALAVERT PO) Take 1 tablet by mouth daily at 6 (six) AM.     meloxicam (MOBIC) 15 MG tablet Take 15 mg by mouth daily.     metoprolol succinate (TOPROL-XL) 50 MG 24 hr tablet Take 50 mg by mouth daily.     Multiple Vitamin (MULTIVITAMIN ADULT PO) MVI     pramipexole (MIRAPEX) 0.125 MG tablet Take 1 tablet (0.125 mg total) by mouth 3 (three) times daily. This is the final dose, follow instructions provided verbally and in after visit summary (AVS in MyChart). 90 tablet 2   scopolamine (TRANSDERM-SCOP) 1 MG/3DAYS Place 1 patch onto the skin every 3 (three) days.     No current facility-administered medications for this visit.    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   LMP 05/13/2011   Physical Examination: General: Well developed, well nourished, NAD  HEENT: OP clear, mucus membranes moist  SKIN: warm, dry. No rashes. Neuro: No focal deficits  Musculoskeletal: Muscle strength 5/5 all ext  Psychiatric: Mood and affect normal  Neck: No JVD, no carotid bruits, no thyromegaly, no lymphadenopathy.  Lungs:Clear bilaterally, no wheezes, rhonci, crackles Cardiovascular: Regular rate and rhythm. No murmurs, gallops or rubs. Abdomen:Soft. Bowel sounds present. Non-tender.  Extremities: No lower extremity edema. Pulses are 2 + in the bilateral DP/PT.  EKG:  EKG {ACTION; IS/IS [id number]} ordered today. The ekg ordered today demonstrates ***  Recent Labs: No results found for requested labs within last 365 days.   Lipid Panel No results found for: "CHOL", "TRIG", "HDL", "CHOLHDL", "VLDL", "LDLCALC", "LDLDIRECT"   Wt Readings from Last 3 Encounters:  10/24/22 108.8 kg  03/20/19 108 kg  03/22/16 109.3 kg       Assessment and Plan:   1.   Labs/ tests ordered today include:  No orders of the defined types were placed in this encounter.    Disposition:   F/U with me in ***    Signed, Verne Carrow, MD, [facility] 12/27/2022 8:03 AM    [facility] [address] [place], [place], [place]  [address] Phone: [phone]; Fax: [phone]

## 2022-12-28 ENCOUNTER — Encounter: Payer: Self-pay | Admitting: Cardiovascular Disease

## 2022-12-28 ENCOUNTER — Ambulatory Visit: Payer: Federal, State, Local not specified - PPO | Attending: Cardiovascular Disease | Admitting: Cardiovascular Disease

## 2022-12-28 VITALS — BP 126/84 | HR 95 | Ht 62.5 in | Wt 237.4 lb

## 2022-12-28 DIAGNOSIS — R000 Tachycardia, unspecified: Secondary | ICD-10-CM | POA: Diagnosis not present

## 2022-12-28 DIAGNOSIS — R600 Localized edema: Secondary | ICD-10-CM | POA: Diagnosis not present

## 2022-12-28 MED ORDER — FUROSEMIDE 40 MG PO TABS: 40.0000 mg | ORAL_TABLET | Freq: Every day | ORAL | 3 refills | Status: DC

## 2022-12-28 NOTE — Patient Instructions (Signed)
**Note De-Identified Spurr Obfuscation** Medication Instructions:  Your physician has recommended you make the following change in your medication:   Lasix 40 mg daily  *If you need a refill on your cardiac medications before your next appointment, please call your pharmacy*   Testing/Procedures: Your physician has requested that you have an echocardiogram. Echocardiography is a painless test that uses sound waves to create images of your heart. It provides your doctor with information about the size and shape of your heart and how well your heart's chambers and valves are working. This procedure takes approximately one hour. There are no restrictions for this procedure. Please do NOT wear cologne, perfume, aftershave, or lotions (deodorant is allowed). Please arrive 15 minutes prior to your appointment time.    Follow-Up: At [facility], you and your health needs are our priority.  As part of our continuing mission to provide you with exceptional heart care, we have created designated Provider Care Teams.  These Care Teams include your primary Cardiologist (physician) and Advanced Practice Providers (APPs -  Physician Assistants and Nurse Practitioners) who all work together to provide you with the care you need, when you need it.   Your next appointment:   1 year(s)  Provider:   Dr Clifton James

## 2023-01-10 ENCOUNTER — Encounter: Payer: Self-pay | Admitting: Neurology

## 2023-01-10 ENCOUNTER — Ambulatory Visit: Payer: Federal, State, Local not specified - PPO | Admitting: Neurology

## 2023-01-10 ENCOUNTER — Telehealth: Payer: Self-pay | Admitting: Neurology

## 2023-01-10 NOTE — Telephone Encounter (Signed)
**Note De-Identified Arterberry Obfuscation** NS for neuro FU appt.

## 2023-01-15 ENCOUNTER — Ambulatory Visit: Payer: Federal, State, Local not specified - PPO | Admitting: Diagnostic Neuroimaging

## 2023-01-15 ENCOUNTER — Ambulatory Visit: Payer: Federal, State, Local not specified - PPO | Admitting: Neurology

## 2023-01-22 ENCOUNTER — Ambulatory Visit: Payer: Federal, State, Local not specified - PPO | Admitting: Neurology

## 2023-01-25 ENCOUNTER — Ambulatory Visit (INDEPENDENT_AMBULATORY_CARE_PROVIDER_SITE_OTHER): Payer: Federal, State, Local not specified - PPO | Admitting: Neurology

## 2023-01-25 ENCOUNTER — Encounter: Payer: Self-pay | Admitting: Neurology

## 2023-01-25 ENCOUNTER — Ambulatory Visit (HOSPITAL_COMMUNITY): Payer: Federal, State, Local not specified - PPO | Attending: Cardiovascular Disease

## 2023-01-25 VITALS — BP 131/86 | HR 101 | Ht 62.5 in | Wt 234.0 lb

## 2023-01-25 DIAGNOSIS — R600 Localized edema: Secondary | ICD-10-CM | POA: Diagnosis not present

## 2023-01-25 DIAGNOSIS — G20C Parkinsonism, unspecified: Secondary | ICD-10-CM

## 2023-01-25 DIAGNOSIS — R000 Tachycardia, unspecified: Secondary | ICD-10-CM | POA: Insufficient documentation

## 2023-01-25 LAB — ECHOCARDIOGRAM COMPLETE
Area-P 1/2: 2.75 cm2
S' Lateral: 2.3 cm

## 2023-01-25 MED ORDER — PRAMIPEXOLE DIHYDROCHLORIDE 0.25 MG PO TABS: 0.2500 mg | ORAL_TABLET | Freq: Three times a day (TID) | ORAL | 3 refills | Status: DC

## 2023-01-25 NOTE — Progress Notes (Signed)
**Note De-Identified Schwimmer Obfuscation** Subjective:    Patient ID: Bethany Carr is a 62 y.o. female.  HPI    Interim history:   Bethany Carr is a 62 year old right-handed female with an underlying medical history of allergies, thrombocytopenia, hypothyroidism, hyperlipidemia, anxiety, asthma, and obesity with a BMI of over 40, who presents for follow-up consultation of her left sided tremor, in keeping with parkinsonism, after an interim DaTscan.  The patient is unaccompanied today.  She missed an appointment on 01/10/2023. I first met her at the request of her primary care physician on 10/24/2022, at which time she reported a several month history of left-sided tremors.  She also had noticed fine motor dyscontrol.  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  She was advised to proceed with a DaTscan.  She had a nuclear medicine DaTscan on 11/30/2022 and I reviewed the results:  IMPRESSION: Decreased radiotracer activity in the posterior RIGHT putamen. Asymmetric activity in the heads of the caudate nuclei.   Of note, DaTSCAN is not diagnostic of Parkinsonian syndromes, which remains a clinical diagnosis. DaTscan is an adjuvant test to aid in the clinical diagnosis of Parkinsonian syndromes.   In addition, I reviewed images through the PACS system. She was notified of the test results by phone call.  We started her on low-dose pramipexole in August 2024 after her DaTscan.  Today, 01/25/2023: She reports doing better with the pramipexole, tolerates the medication, currently taking 0.125 mg of pramipexole 3 times daily, usually at 8 AM, 4 PM and 10 or 11 PM which is her bedtime.  Her daughter got married in early September 2024, the patient feels that she was able to get through the wedding with less stress and less tremors because of the pramipexole.  She tries to stay active mentally and physically, still works full-time.  She has made some lifestyle changes, she has done some reading on Parkinson's disease and has signed up for  the [employer].  She has a family history of aortic aneurysm and she is in regular follow-up with cardiology and usually has an echocardiogram once a year. She had an echocardiogram today, aortic root was normal.  Previously:  10/24/2022: (She) reports a history of left-sided tremors and fine motor dyscontrol in the left upper and lower extremities for the past several months, symptoms started in October 2023 and have been plateaued for the most part.  In fact, she feels that her left leg tremor has actually improved.  I reviewed your office note from 09/05/2022.  She was started on metoprolol 25 mg daily long-acting, for concern for essential tremor.  This was recently increased to 50 mg once daily about a week ago.  She also had blood work in your office about a week ago, we will request blood test results as these are not currently available for my review today.  She does not believe that the tremor has responded to the beta-blocker.  She is not aware of any family history of tremors or Parkinson's disease.  She has been seeing a chiropractor who noticed stiffness in the left upper extremity.  She is right-handed.  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  She can work from home about 3 days a week.  She drinks caffeine in limitation, usually 1 cup of coffee.  She tries to hydrate well.  She has rare alcohol, she is a non-smoker.  She lives with her husband and currently their 6 year old daughter lives **Note De-Identified Klosinski Obfuscation** with along with her 9-month-old daughter.  They also have a son who is 53 and lives in [place].  She has not fallen.  She has had stress, particularly with regards to her mom's health, mom is 95 and lives in [place].  She has had more anxiety.  Patient uses Xanax as needed.  She denies any significant snoring, may have occasional snoring, feels that she sleeps fairly well currently.    Her Past Medical History Is Significant For: Past Medical  History:  Diagnosis Date   Anxiety    Asthma    Breast cyst, right    Dyslipidemia    Estrogen deficiency    History of ITP    during pregnancy   Hypothyroidism    Morbid obesity (HCC)    Right hip pain    pt states back pain   Seasonal allergic rhinitis     Her Past Surgical History Is Significant For: Past Surgical History:  Procedure Laterality Date   BREAST BIOPSY Right 2021   CESAREAN SECTION      Her Family History Is Significant For: Family History  Problem Relation Age of Onset   Hypertension Mother    Breast cancer Mother        hx of 62   Heart failure Mother    Aortic aneurysm Father        abdominal   Breast cancer Sister    Aortic aneurysm Brother        abdominal   Healthy Brother    Healthy Brother     Her Social History Is Significant For: Social History   Socioeconomic History   Marital status: Married    Spouse name: Not on file   Number of children: Not on file   Years of education: Not on file   Highest education level: Not on file  Occupational History   Occupation: Nurse  Tobacco Use   Smoking status: Never   Smokeless tobacco: Never  Vaping Use   Vaping status: Never Used  Substance and Sexual Activity   Alcohol use: No   Drug use: No   Sexual activity: Yes  Other Topics Concern   Not on file  Social History Narrative   Caffiene 2 cups daily   Married   2 kids   Education some college   Works as an Human resources officer Strain: Not on Ship broker Insecurity: Not on file  Transportation Needs: Not on file  Physical Activity: Not on file  Stress: Not on file  Social Connections: Not on file    Her Allergies Are:  No Known Allergies:   Her Current Medications Are:  Outpatient Encounter Medications as of 01/25/2023  Medication Sig   ALPRAZolam (XANAX) 0.5 MG tablet Take 0.5 mg by mouth 2 (two) times daily as needed.   atorvastatin (LIPITOR) 40 MG tablet Take 40 mg by mouth daily.    furosemide (LASIX) 40 MG tablet Take 1 tablet (40 mg total) by mouth daily.   ibuprofen (ADVIL) 200 MG tablet Take 200 mg by mouth every 6 (six) hours as needed.   Loratadine (ALAVERT PO) Take 1 tablet by mouth daily at 6 (six) AM.   Multiple Vitamin (MULTIVITAMIN ADULT PO) MVI   pramipexole (MIRAPEX) 0.125 MG tablet Take 1 tablet (0.125 mg total) by mouth 3 (three) times daily. This is the final dose, follow instructions provided verbally and in after visit summary (AVS in MyChart).   meloxicam (MOBIC) **Note De-Identified Murdock Obfuscation** 15 MG tablet Take 15 mg by mouth daily. (Patient not taking: Reported on 01/25/2023)   [DISCONTINUED] metoprolol succinate (TOPROL-XL) 50 MG 24 hr tablet Take 50 mg by mouth daily. (Patient not taking: Reported on 12/28/2022)   No facility-administered encounter medications on file as of 01/25/2023.  :  Review of Systems:  Out of a complete 14 point review of systems, all are reviewed and negative with the exception of these symptoms as listed below:  Review of Systems  Neurological:        Patient is here alone for follow-up of dat scan results and to discuss treatment. She feels Pramipexole is helpful. She takes 0.125 mg three times daily. She is off Metoprolol because it did not help her tremors and her vitals were fine without it.      Objective:  Neurological Exam  Physical Exam Physical Examination:   Vitals:   01/25/23 1502  BP: 131/86  Pulse: (!) 101    General Examination: The patient is a very pleasant 62 y.o. female in no acute distress. She appears well-developed and well-nourished and well groomed.   HEENT: Normocephalic, atraumatic, pupils are equal, round and reactive to light, extraocular tracking is good without limitation to gaze excursion or nystagmus noted. Hearing is grossly intact. Face is symmetric with mild facial masking, no significant speech impairment, in particular, no dysarthria, no significant hypophonia.  No voice tremor.  Neck with mild rigidity noted,  good range of motion, no carotid bruits, stable.  Airway examination reveals mild mouth dryness, tongue protrudes centrally and palate elevates symmetrically.  Good tongue movements.    Chest: Clear to auscultation without wheezing, rhonchi or crackles noted.   Heart: S1+S2+0, regular and normal without murmurs, rubs or gallops noted.    Abdomen: Soft, non-tender and non-distended.   Extremities: There is significant puffiness noted in the distal lower extremities bilaterally, particularly around the ankles but essentially nonpitting, and appears stable.    Skin: Warm and dry without trophic changes noted.    Musculoskeletal: exam reveals no obvious joint deformities.    Neurologically:  Mental status: The patient is awake, alert and oriented in all 4 spheres. Her immediate and remote memory, attention, language skills and fund of knowledge are appropriate. There is no evidence of aphasia, agnosia, apraxia or anomia. Speech is clear with normal prosody and enunciation. Thought process is linear. Mood is normal and affect is normal.  Cranial nerves II - XII are as described above under HEENT exam.  Motor exam: Normal bulk, strength and tone is noted, with the exception of mild increase in the left upper extremity tone, intermittent mild resting tremor in the left upper extremity, slightly less than before. No significant resting tremor in the lower extremities noted, no right hand tremor noted.    (On 10/24/2022: On Archimedes spiral drawing she has no significant trembling with her right hand, mild insecurity with her left hand including slight trembling, handwriting with the right hand is legible, not particularly tremulous, not particularly micrographic.)  She has a slight left upper extremity postural tremor and action tremor, no intention tremor.    Fine motor skills and coordination: She has mild impairment on the left side, better on the right.    Cerebellar testing: No dysmetria or  intention tremor. There is no truncal or gait ataxia.    Sensory exam: intact to light touch in the upper and lower extremities.   Gait, station and balance: She stands without difficulty, posture is age-appropriate, she walks **Note De-Identified Covalt Obfuscation** with a fairly good pace, slightly smaller stride length, decreased arm swing on the left, no significant difficulty with turns.  No telltale shuffling, stable.   Assessment and Plan:  In summary, Bethany Carr is a very pleasant 62 year old right-handed female with an underlying medical history of allergies, thrombocytopenia, hypothyroidism, hyperlipidemia, anxiety, asthma, and obesity with a BMI of over 40, who presents for follow-up consultation of her left sided parkinsonism.  Her DaTscan from 11/30/2022 was supportive of an underlying parkinsonian syndrome with decreased radiotracer activity in the posterior right putamen, asymmetric activity in the head of the caudate nuclei.  We started low-dose pramipexole 0.125 mg strength in late August 2024.  She tolerates the medication and is currently taking 1 pill 3 times daily.  She has noticed some improvement in her tremor.  She has made some lifestyle changes and tries to stay active mentally and physically, has added some supplements including turmeric.  She is encouraged to consider adding coenzyme every 10, 400 mg 3 times daily as there was evidence in a remote and large-scale study that high-dose coenzyme every 10 may be favorable for longer-term outcome and slowing possibly the progression in Parkinson's disease.  We talked about the importance of fall prevention and good hydration, getting enough rest.  She is at this juncture advised to increase her pramipexole to 0.25 mg 3 times daily.  She can try to take the last dose around 8 PM rather than bedtime but if it is better for her to take it at bedtime she can switch back.  I placed a new prescription.  She can also use up her current 0.125 mg strength pills and double up on  them if she would like to finish them.  She is advised to follow-up in this clinic to see one of our nurse practitioners routinely in about 6 to 8 months, sooner if needed.  She is encouraged to call us or email [place] through MyChart for any interim questions or concerns.  She is also encouraged to look at the academic institutions locally here in this area and sign up for a newsletter through their neurology website or get in touch with the neurology research coordinator and see if there could be any potential clinical trials she could participate in in the near future for Parkinson's disease.  I answered all her questions today and she was in agreement. I spent 40 minutes in total face-to-face time and in reviewing records during pre-charting, more than 50% of which was spent in counseling and coordination of care, reviewing test results, reviewing medications and treatment regimen and/or in discussing or reviewing the diagnosis of Parkinson's disease, the prognosis and treatment options. Pertinent laboratory and imaging test results that were available during this visit with the patient were reviewed by me and considered in my medical decision making (see chart for details).

## 2023-01-25 NOTE — Patient Instructions (Addendum)
**Note De-Identified Welshans Obfuscation** It was nice to see you again today.  Thank you for sharing the pictures from your daughter's wedding, you all looked beautiful and your mom and your 62-year-old granddaughter are so cute.    I am glad that you are able to tolerate the pramipexole.  As discussed, we will increase the dose to 0.25 mg strength, take 1 pill 3 times daily, 8 AM, 4 PM and 8 PM daily.  If it is better for you to take it at bedtime between 10 and 11 for the last dose, feel free to adjust the timing. Try to stay active mentally and physically.  Try to incorporate physical activity on a regular basis and stay well-hydrated with water.  Your can consider taking over-the-counter coenzyme Q 10 400 mg 3 times daily.  There was a study in the distant past that suggested that high-dose coenzyme every day could be favorable in Parkinson's disease patients to potentially slow down the progression.  Feel free to go on the website for our academic institutions, i.e. [facility], [facility], and [facility] to see if there are any clinical trials offered for Parkinson's patients that you might be interested in.

## 2023-02-12 ENCOUNTER — Encounter: Payer: Self-pay | Admitting: Neurology

## 2023-02-12 DIAGNOSIS — G20C Parkinsonism, unspecified: Secondary | ICD-10-CM

## 2023-02-13 MED ORDER — PRAMIPEXOLE DIHYDROCHLORIDE 0.5 MG PO TABS: 0.5000 mg | ORAL_TABLET | Freq: Three times a day (TID) | ORAL | 1 refills | Status: DC

## 2023-02-13 NOTE — Telephone Encounter (Signed)
**Note De-Identified Lamadrid Obfuscation** Okay to increase pramipexole to 0.5 mg 3 times daily, prescription sent to pharmacy.

## 2023-03-07 ENCOUNTER — Ambulatory Visit: Payer: Federal, State, Local not specified - PPO | Admitting: Neurology

## 2023-03-28 ENCOUNTER — Ambulatory Visit: Payer: Federal, State, Local not specified - PPO | Admitting: Neurology

## 2023-04-05 ENCOUNTER — Encounter: Payer: Self-pay | Admitting: Neurology

## 2023-04-05 DIAGNOSIS — G20C Parkinsonism, unspecified: Secondary | ICD-10-CM

## 2023-04-09 NOTE — Telephone Encounter (Signed)
**Note De-Identified Sluka Obfuscation** I spoke with the patient and with Dr. Frances Furbish.  The patient understands that she is taking more than the recommended maximum dose per day of 4.5 mg. She is taking a total of 12 pills (4 pills three times daily) per day.  She understands that she is to reduce her total daily dose by 1 pill (0.5 mg) every 3 days, so for example, starting today she will take a total of 11 pills for 3 days, then 10 total pills daily for 3 days, then 9, etc until she eventually reaches a total of 6 pills per day (1 mg three times daily). Today she has already taken 4 pills this morning. She will take another 4 tablets for her 2nd dose then drop to 3 pills for her third dose tonight. She will continue for 3 days and then reduce further according to the schedule above. I explained to the patient very kindly that it is dangerous for her to self-dose these kinds of medications and she should not abruptly reduce it either due to withdrawal symptom risks. The side effects can be magnified on high doses including dizziness, sleepiness/falling asleep at the wheel, worsened tremor, etc. She is to notify us anytime she feels a dose needs to be changed so we can advise her. Pt understands the provider may not elect to always increase dose but potentially add or change medication in her regimen. The patient verbalized the understanding that she will call us next time any dose change is felt by patient to be needed. She was very appreciative for the call.

## 2023-04-09 NOTE — Telephone Encounter (Signed)
**Note De-Identified Strickler Obfuscation** The patient is taking a very high dose of pramipexole which is higher than the recommended maximum dose of 4.5 mg total dose per day.  Please call her back ASAP and advise her that she is taking too high a dose; how this came about is unclear to me. Please gradually have her reduce her medication by 0.5 mg every 3 days to where she is taking 1 mg 3 times daily eventually and she can stay on this dose.  Please make sure she understands to reduce and that we will do this every 3 days to where she ends up taking 1 mg 3 times a day for a total of 3 mg daily.  Please make sure you get verbal feedback from her. The instructions on the bottle should have been clear, did she have any confusion about the instructions?

## 2023-04-23 MED ORDER — PRAMIPEXOLE DIHYDROCHLORIDE 0.5 MG PO TABS: ORAL_TABLET | ORAL | 2 refills | Status: DC

## 2023-04-23 NOTE — Addendum Note (Signed)
**Note De-Identified Archuleta Obfuscation** Addended by: Bertram Savin on: 04/23/2023 01:34 PM   Modules accepted: Orders

## 2023-06-05 DIAGNOSIS — Z6841 Body Mass Index (BMI) 40.0 and over, adult: Secondary | ICD-10-CM | POA: Diagnosis not present

## 2023-06-05 DIAGNOSIS — I5030 Unspecified diastolic (congestive) heart failure: Secondary | ICD-10-CM | POA: Diagnosis not present

## 2023-06-26 ENCOUNTER — Encounter: Payer: Self-pay | Admitting: Neurology

## 2023-07-17 NOTE — Telephone Encounter (Signed)
**Note De-Identified Cua Obfuscation** Pt attached a form she completed. Here are her requests:   What specific accommodation(s) are you requesting, if known? Telework with no more than 2 days/week in office as needed.    How will that accommodation(s) assist you to perform your job? It will decrease the amount of time needed to drive.       Have you had any accommodations in the past for this same limitation? YES/NO (circle).  If YES, for each accommodation, describe the accommodation, when it was in place, and how it helped you perform your job.   Please provide any additional information that might be useful in processing your accommodation request.  I have been diagnosed with Parkinson's Disease for less than a year and I'm undergoing medication changes and dosage adjustments.   The medication changes tend to make me fatigued.  Although the medications help my left sided tremors, the tremors do continue, and my gait is still shaky and less steady.

## 2023-07-23 DIAGNOSIS — Z0289 Encounter for other administrative examinations: Secondary | ICD-10-CM

## 2023-07-24 NOTE — Telephone Encounter (Signed)
**Note De-Identified Lebarron Obfuscation** Accommodation form completed, signed, and sent to medical records.

## 2023-07-25 ENCOUNTER — Encounter: Payer: Self-pay | Admitting: *Deleted

## 2023-08-02 ENCOUNTER — Telehealth: Payer: Self-pay | Admitting: *Deleted

## 2023-08-02 NOTE — Telephone Encounter (Signed)
**Note De-Identified Lalor Obfuscation** Pt assessment form faxed to [phone] and to [phone] on 08/02/2023

## 2023-08-02 NOTE — Telephone Encounter (Signed)
**Note De-Identified Stellmach Obfuscation** Accomodation p/w signed. To Bethany Carr in Medical Records to fax and then call pt.

## 2023-08-08 NOTE — Progress Notes (Unsigned)
**Note De-Identified via Carr** **Note De-Identified Bethany Carr** [facility] [address] [place]. [place] [address] [phone]       OFFICE FOLLOW UP NOTE  [name] Date of Birth:  17-Dec-1960 Medical Record Number:  [id number]    Primary neurologist: Dr. Frances Carr Reason for visit: Parkinson's disease    SUBJECTIVE:  CHIEF COMPLAINT:  No chief complaint on file.    HPI:   Update 08/09/2023 JM: Patient returns for follow-up visit after prior visit with Dr. Frances Carr 6 months ago.  Pramipexole dose increased to 0.25 mg 3 times daily.            History from Dr. Frances Carr prior OV note for reference purposes only Bethany Carr is a 63 year old right-handed female with an underlying medical history of allergies, thrombocytopenia, hypothyroidism, hyperlipidemia, anxiety, asthma, and obesity with a BMI of over 40, who presents for follow-up consultation of her left sided tremor, in keeping with parkinsonism, after an interim DaTscan.  The patient is unaccompanied today.  She missed an appointment on 01/10/2023. I first met her at the request of her primary care physician on 10/24/2022, at which time she reported a several month history of left-sided tremors.  She also had noticed fine motor dyscontrol.  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  She was advised to proceed with a DaTscan.  She had a nuclear medicine DaTscan on 11/30/2022 and I reviewed the results:   IMPRESSION: Decreased radiotracer activity in the posterior RIGHT putamen. Asymmetric activity in the heads of the caudate nuclei.   Of note, DaTSCAN is not diagnostic of Parkinsonian syndromes, which remains a clinical diagnosis. DaTscan is an adjuvant test to aid in the clinical diagnosis of Parkinsonian syndromes.   In addition, I reviewed images through the PACS system. She was notified of the test results by phone call.   We started her on low-dose pramipexole in August 2024 after her DaTscan.   Today, 01/25/2023: She reports doing better with the  pramipexole, tolerates the medication, currently taking 0.125 mg of pramipexole 3 times daily, usually at 8 AM, 4 PM and 10 or 11 PM which is her bedtime.  Her daughter got married in early September 2024, the patient feels that she was able to get through the wedding with less stress and less tremors because of the pramipexole.  She tries to stay active mentally and physically, still works full-time.  She has made some lifestyle changes, she has done some reading on Parkinson's disease and has signed up for the [employer].  She has a family history of aortic aneurysm and she is in regular follow-up with cardiology and usually has an echocardiogram once a year. She had an echocardiogram today, aortic root was normal.   Previously:   10/24/2022: (She) reports a history of left-sided tremors and fine motor dyscontrol in the left upper and lower extremities for the past several months, symptoms started in October 2023 and have been plateaued for the most part.  In fact, she feels that her left leg tremor has actually improved.  I reviewed your office note from 09/05/2022.  She was started on metoprolol 25 mg daily long-acting, for concern for essential tremor.  This was recently increased to 50 mg once daily about a week ago.  She also had blood work in your office about a week ago, we will request blood test results as these are not currently available for my review today.  She does not believe that the tremor has responded to the beta-blocker. **Note De-Identified Perra Carr** She is not aware of any family history of tremors or Parkinson's disease.  She has been seeing a chiropractor who noticed stiffness in the left upper extremity.  She is right-handed.  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  She can work from home about 3 days a week.  She drinks caffeine in limitation, usually 1 cup of coffee.  She tries to hydrate well.  She has rare alcohol, she is a non-smoker.  She lives with  her husband and currently their 60 year old daughter lives with along with her 35-month-old daughter.  They also have a son who is 61 and lives in [place].  She has not fallen.  She has had stress, particularly with regards to her mom's health, mom is 95 and lives in [place].  She has had more anxiety.  Patient uses Xanax as needed.  She denies any significant snoring, may have occasional snoring, feels that she sleeps fairly well currently.        ROS:   14 system review of systems performed and negative with exception of ***  PMH:  Past Medical History:  Diagnosis Date   Anxiety    Asthma    Breast cyst, right    Dyslipidemia    Estrogen deficiency    History of ITP    during pregnancy   Hypothyroidism    Morbid obesity (HCC)    Right hip pain    pt states back pain   Seasonal allergic rhinitis     PSH:  Past Surgical History:  Procedure Laterality Date   BREAST BIOPSY Right 2021   CESAREAN SECTION      Social History:  Social History   Socioeconomic History   Marital status: Married    Spouse name: Not on file   Number of children: Not on file   Years of education: Not on file   Highest education level: Not on file  Occupational History   Occupation: Nurse  Tobacco Use   Smoking status: Never   Smokeless tobacco: Never  Vaping Use   Vaping status: Never Used  Substance and Sexual Activity   Alcohol use: No   Drug use: No   Sexual activity: Yes  Other Topics Concern   Not on file  Social History Narrative   Caffiene 2 cups daily   Married   2 kids   Education some college   Works as an Charity fundraiser   Social Drivers of Corporate investment banker Strain: Not on Ship broker Insecurity: Not on Chartered certified accountant Needs: Not on file  Physical Activity: Not on file  Stress: Not on file  Social Connections: Not on file  Intimate Partner Violence: Not on file    Family History:  Family History  Problem Relation Age of Onset   Hypertension Mother     Breast cancer Mother        hx of 1988   Heart failure Mother    Aortic aneurysm Father        abdominal   Breast cancer Sister    Aortic aneurysm Brother        abdominal   Healthy Brother    Healthy Brother     Medications:   Current Outpatient Medications on File Prior to Visit  Medication Sig Dispense Refill   ALPRAZolam (XANAX) 0.5 MG tablet Take 0.5 mg by mouth 2 (two) times daily as needed.     atorvastatin (LIPITOR) 40 MG **Note De-Identified Corpening Carr** tablet Take 40 mg by mouth daily.     furosemide (LASIX) 40 MG tablet Take 1 tablet (40 mg total) by mouth daily. 90 tablet 3   ibuprofen (ADVIL) 200 MG tablet Take 200 mg by mouth every 6 (six) hours as needed.     Loratadine (ALAVERT PO) Take 1 tablet by mouth daily at 6 (six) AM.     meloxicam (MOBIC) 15 MG tablet Take 15 mg by mouth daily. (Patient not taking: Reported on 01/25/2023)     Multiple Vitamin (MULTIVITAMIN ADULT PO) MVI     pramipexole (MIRAPEX) 0.5 MG tablet Wean as directed to goal regimen of 1 mg (2 tablets) by mouth three times daily. 180 tablet 2   No current facility-administered medications on file prior to visit.    Allergies:  No Known Allergies    OBJECTIVE:  Physical Exam  There were no vitals filed for this visit. There is no height or weight on file to calculate BMI. No results found.      No data to display           General: well developed, well nourished, seated, in no evident distress Head: head normocephalic and atraumatic.   Neck: supple with no carotid or supraclavicular bruits Cardiovascular: regular rate and rhythm, no murmurs Musculoskeletal: no deformity Skin:  no rash/petichiae Vascular:  Normal pulses all extremities   Neurologic Exam Mental Status: Awake and fully alert. Oriented to place and time. Recent and remote memory intact. Attention span, concentration and fund of knowledge appropriate. Mood and affect appropriate.  Cranial Nerves: Pupils equal, briskly reactive to light. Extraocular  movements full without nystagmus. Visual fields full to confrontation. Hearing intact. Facial sensation intact. Face, tongue, palate moves normally and symmetrically.  Motor: Normal bulk and tone. Normal strength in all tested extremity muscles Sensory.: intact to touch , pinprick , position and vibratory sensation.  Coordination: Rapid alternating movements normal in all extremities. Finger-to-nose and heel-to-shin performed accurately bilaterally. Gait and Station: Arises from chair without difficulty. Stance is normal. Gait demonstrates normal stride length and balance with ***. Tandem walk and heel toe ***.  Reflexes: 1+ and symmetric. Toes downgoing.    Mental status: The patient is awake, alert and oriented in all 4 spheres. Her immediate and remote memory, attention, language skills and fund of knowledge are appropriate. There is no evidence of aphasia, agnosia, apraxia or anomia. Speech is clear with normal prosody and enunciation. Thought process is linear. Mood is normal and affect is normal.  Cranial nerves II - XII are as described above under HEENT exam.  Motor exam: Normal bulk, strength and tone is noted, with the exception of mild increase in the left upper extremity tone, intermittent mild resting tremor in the left upper extremity, slightly less than before. No significant resting tremor in the lower extremities noted, no right hand tremor noted.    (On 10/24/2022: On Archimedes spiral drawing she has no significant trembling with her right hand, mild insecurity with her left hand including slight trembling, handwriting with the right hand is legible, not particularly tremulous, not particularly micrographic.)   She has a slight left upper extremity postural tremor and action tremor, no intention tremor.     Fine motor skills and coordination: She has mild impairment on the left side, better on the right.     Cerebellar testing: No dysmetria or intention tremor. There is no  truncal or gait ataxia.     Sensory exam: intact to light touch in **Note De-Identified Donathan Carr** the upper and lower extremities.    Gait, station and balance: She stands without difficulty, posture is age-appropriate, she walks with a fairly good pace, slightly smaller stride length, decreased arm swing on the left, no significant difficulty with turns.  No telltale shuffling, stable.     ASSESSMENT/PLAN: Farheen Pfahler Dieu is a 62 y.o. year old female with left sided predominant Parkinson's disease.  DaTscan on 11/2022 consistent with Parkinson's disease.      Parkinson's disease:  Continue pramipexole 0.25 mg TID     Follow up in *** or call earlier if needed   CC:  PCP: Lorenda Ishihara, MD    I spent *** minutes of face-to-face and non-face-to-face time with patient.  This included previsit chart review, lab review, study review, order entry, electronic health record documentation, patient education and discussion regarding above diagnoses and treatment plan and answered all other questions to patient's satisfaction    Ihor Austin, [facility]  [facility] [address] [place], [place] [address]  Phone [phone] Fax [phone] Note: This document was prepared with digital dictation and possible smart phrase technology. Any transcriptional errors that result from this process are unintentional.

## 2023-08-09 ENCOUNTER — Ambulatory Visit: Payer: Federal, State, Local not specified - PPO | Admitting: Adult Health

## 2023-08-09 ENCOUNTER — Encounter: Payer: Self-pay | Admitting: Adult Health

## 2023-08-09 VITALS — BP 141/89 | HR 71 | Ht 62.0 in | Wt 232.0 lb

## 2023-08-09 DIAGNOSIS — G20A1 Parkinson's disease without dyskinesia, without mention of fluctuations: Secondary | ICD-10-CM | POA: Diagnosis not present

## 2023-08-09 DIAGNOSIS — F419 Anxiety disorder, unspecified: Secondary | ICD-10-CM

## 2023-08-09 MED ORDER — PRAMIPEXOLE DIHYDROCHLORIDE ER 1.5 MG PO TB24: 1.5000 mg | ORAL_TABLET | Freq: Every evening | ORAL | 11 refills | Status: DC

## 2023-08-09 MED ORDER — PRAMIPEXOLE DIHYDROCHLORIDE ER 0.375 MG PO TB24: 0.3750 mg | ORAL_TABLET | Freq: Every day | ORAL | 11 refills | Status: DC

## 2023-08-09 NOTE — Patient Instructions (Addendum)
**Note De-Identified Charrette Obfuscation** Your Plan:  Change Mirapex to extended release form - you will take 1 1.5mg  tablet and 1 0.375mg  tablet nightly for 1 week. After 1 week if tolerating well but tremors persist, please let me know   Continue to monitor your anxiety - if this should worsen, please let me know or follow up with your PCP       Follow up in 6 months or call earlier if needed      Thank you for coming to see [place] at [facility]. I hope we have been able to provide you high quality care today.  You may receive a patient satisfaction survey over the next few weeks. We would appreciate your feedback and comments so that we may continue to improve ourselves and the health of our patients.

## 2023-08-14 ENCOUNTER — Other Ambulatory Visit: Payer: Self-pay | Admitting: Neurology

## 2023-08-14 DIAGNOSIS — G20C Parkinsonism, unspecified: Secondary | ICD-10-CM

## 2023-08-27 ENCOUNTER — Other Ambulatory Visit: Payer: Self-pay | Admitting: Adult Health

## 2023-08-27 MED ORDER — PRAMIPEXOLE DIHYDROCHLORIDE ER 2.25 MG PO TB24: 2.2500 mg | ORAL_TABLET | Freq: Every evening | ORAL | 11 refills | Status: DC

## 2023-09-13 DIAGNOSIS — M7732 Calcaneal spur, left foot: Secondary | ICD-10-CM | POA: Diagnosis not present

## 2023-09-13 DIAGNOSIS — M79672 Pain in left foot: Secondary | ICD-10-CM | POA: Diagnosis not present

## 2023-09-14 ENCOUNTER — Encounter: Payer: Self-pay | Admitting: Cardiovascular Disease

## 2023-09-14 ENCOUNTER — Other Ambulatory Visit: Payer: Self-pay

## 2023-09-14 MED ORDER — SPIRONOLACTONE 25 MG PO TABS: 25.0000 mg | ORAL_TABLET | Freq: Every day | ORAL | 3 refills | Status: AC

## 2023-09-14 NOTE — Telephone Encounter (Signed)
**Note De-Identified Golomb Obfuscation** Spoke to patient Dr.McAlhany advised ok to stop Lasix  and start Aldactone 25 mg daily.Prescription sent to your pharmacy.

## 2023-10-08 ENCOUNTER — Ambulatory Visit: Admitting: Podiatry

## 2023-10-08 ENCOUNTER — Ambulatory Visit (INDEPENDENT_AMBULATORY_CARE_PROVIDER_SITE_OTHER)

## 2023-10-08 DIAGNOSIS — M722 Plantar fascial fibromatosis: Secondary | ICD-10-CM | POA: Diagnosis not present

## 2023-10-08 MED ORDER — BETAMETHASONE SOD PHOS & ACET 6 (3-3) MG/ML IJ SUSP
3.0000 mg | Freq: Once | INTRAMUSCULAR | Status: AC
  Administered 2023-10-08: 3 mg via INTRA_ARTICULAR

## 2023-10-08 NOTE — Progress Notes (Signed)
**Note De-identified Hotz Obfuscation**   **Note De-Identified Edgerly Obfuscation** Chief Complaint  Patient presents with   Foot Pain    Left heel pain for about a month pt stated that the pain is sharp and intense no recent injuries     Subjective: 63 y.o. female Presenting today as a new patient for evaluation of left heel pain.  Onset for about 1 month.  Idiopathic onset.  She went to an urgent care and they prescribed a Medrol  Dosepak as well as meloxicam.  She continues to take it.  Minimal relief.  Presenting for follow-up   Past Medical History:  Diagnosis Date   Anxiety    Asthma    Breast cyst, right    Dyslipidemia    Estrogen deficiency    History of ITP    during pregnancy   Hypothyroidism    Morbid obesity (HCC)    Right hip pain    pt states back pain   Seasonal allergic rhinitis      Objective: Physical Exam General: The patient is alert and oriented x3 in no acute distress.  Dermatology: Skin is warm, dry and supple bilateral lower extremities. Negative for open lesions or macerations bilateral.   Vascular: Dorsalis Pedis and Posterior Tibial pulses palpable bilateral.  Capillary fill time is immediate to all digits.  Neurological: Epicritic and protective threshold intact bilateral.   Musculoskeletal: Tenderness to palpation to the plantar aspect of the left heel along the plantar fascia. All other joints range of motion within normal limits bilateral. Strength 5/5 in all groups bilateral.   Radiographic exam LT foot 10/08/2023: Normal osseous mineralization. Joint spaces preserved. No fracture/dislocation/boney destruction. No other soft tissue abnormalities or radiopaque foreign bodies.   Assessment: 1. Plantar fasciitis left foot 2.  Pes planus left  Plan of Care:  -Patient evaluated.  X-rays reviewed -Injection of 0.5 cc Celestone  Soluspan injected along the plantar fascia left -Patient has an active prescription for meloxicam.  Continue -Refrain from going barefoot.  Continue good supportive tennis shoes and  sneakers -Currently the patient is wearing sketchers memory foam.  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 Dot Gazella, DPM    [address] [place], [place] [address]                Office [phone]  Fax [phone]

## 2023-10-29 ENCOUNTER — Ambulatory Visit (INDEPENDENT_AMBULATORY_CARE_PROVIDER_SITE_OTHER)

## 2023-10-29 ENCOUNTER — Ambulatory Visit (INDEPENDENT_AMBULATORY_CARE_PROVIDER_SITE_OTHER): Admitting: Podiatry

## 2023-10-29 DIAGNOSIS — M722 Plantar fascial fibromatosis: Secondary | ICD-10-CM | POA: Diagnosis not present

## 2023-10-29 DIAGNOSIS — M7752 Other enthesopathy of left foot: Secondary | ICD-10-CM

## 2023-10-29 MED ORDER — MELOXICAM 15 MG PO TABS
15.0000 mg | ORAL_TABLET | Freq: Every day | ORAL | 0 refills | Status: DC | PRN
Start: 2023-10-29 — End: 2023-11-28

## 2023-10-29 NOTE — Progress Notes (Unsigned)
**Note De-Identified Demore Obfuscation** Subjective: Chief Complaint  Patient presents with   Foot Pain    RM#13 Left heel pain follow up injection has good relief doing much better.   63 year old female presents the office today for follow-up evaluation of left heel pain.  She said overall she is doing much better she is happy with her progress.  She is more tender now symptoms of the pain that she is having.  The injection did well without any complications.  She does some home stretching exercises.  She has not reported any injuries.  Objective: AAO x3, NAD DP/PT pulses palpable bilaterally, CRT less than 3 seconds There is mild tenderness palpation to the plantar medial tubercle of the calcaneus at the insertion of plantar fascia on the left side.  There is no pain with lateral compression of the calcaneus.  There is no edema, erythema.  Negative Tinel's sign.  No pain with Achilles tendon.  Flexor, extensor tendons appear to be intact. Flatfoot is present. No pain with calf compression, swelling, warmth, erythema  Assessment: Left heel pain, plantar fasciitis  Plan: -All treatment options discussed with the patient including all alternatives, risks, complications.  -X-rays obtained reviewed.  3 views of the foot were obtained.  Decreased calcaneal inclination angle.  Elevation of the first ray.  No evidence of acute fracture. -Offered a repeat steroid injection but she is doing better so she wants to hold off on this.  Discussed stretching, icing on regular basis as well as continue shoes, good arch support.  Discussed that even on the days that she is feeling better to continue with the rehab exercises to help prevent reoccurrence in conjunction with supportive shoe gear. -Patient encouraged to call the office with any questions, concerns, change in symptoms.   Donnice JONELLE Fees DPM

## 2023-10-29 NOTE — Patient Instructions (Signed)

## 2023-11-16 ENCOUNTER — Encounter: Payer: Self-pay | Admitting: Adult Health

## 2023-11-19 MED ORDER — PRAMIPEXOLE DIHYDROCHLORIDE ER 0.75 MG PO TB24: 0.7500 mg | ORAL_TABLET | Freq: Every evening | ORAL | 11 refills | Status: DC

## 2023-11-19 NOTE — Addendum Note (Signed)
**Note De-Identified Fasnacht Obfuscation** Addended by: WHITFIELD RAISIN L on: 11/19/2023 03:14 PM   Modules accepted: Orders

## 2023-11-19 NOTE — Telephone Encounter (Signed)
**Note De-Identified Neils Obfuscation** Spoke w/Pt regarding [facility] message. Pt states on 5/20 and 09/19/23 she and Harlene were corresponding regarding her medication dosage increase (found notes attached to 06/26/23 message). At that time Harlene recommended adding 0.375 tab to her 2.25 mg tab. Pt stated she did that and has stayed on that dose since May but has noticed little to no improvement in her tremors. She stated anxiety and fatigue make the tremors worse. She has refills for the 2.25 mg but no refills for the 0.375 mg, however she is asking if she should continue the dose of 2.625 mg or if it needs to be increased. Informed Pt will send message to NP and will reach out to her when response received. Pt voiced understanding and thanks for the call.

## 2023-11-28 ENCOUNTER — Other Ambulatory Visit: Payer: Self-pay | Admitting: Podiatry

## 2023-12-07 ENCOUNTER — Other Ambulatory Visit: Payer: Self-pay | Admitting: Internal Medicine

## 2023-12-07 DIAGNOSIS — Z1231 Encounter for screening mammogram for malignant neoplasm of breast: Secondary | ICD-10-CM

## 2023-12-12 DIAGNOSIS — E039 Hypothyroidism, unspecified: Secondary | ICD-10-CM | POA: Diagnosis not present

## 2023-12-12 DIAGNOSIS — Z0000 Encounter for general adult medical examination without abnormal findings: Secondary | ICD-10-CM | POA: Diagnosis not present

## 2023-12-12 DIAGNOSIS — F418 Other specified anxiety disorders: Secondary | ICD-10-CM | POA: Diagnosis not present

## 2023-12-12 DIAGNOSIS — G20A1 Parkinson's disease without dyskinesia, without mention of fluctuations: Secondary | ICD-10-CM | POA: Diagnosis not present

## 2023-12-12 DIAGNOSIS — Z23 Encounter for immunization: Secondary | ICD-10-CM | POA: Diagnosis not present

## 2023-12-25 ENCOUNTER — Ambulatory Visit
Admission: RE | Admit: 2023-12-25 | Discharge: 2023-12-25 | Disposition: A | Discharge status: Home / Self Care | Source: Ambulatory Visit | Attending: Internal Medicine | Admitting: Internal Medicine

## 2023-12-25 DIAGNOSIS — Z1231 Encounter for screening mammogram for malignant neoplasm of breast: Secondary | ICD-10-CM | POA: Diagnosis not present

## 2024-01-01 ENCOUNTER — Other Ambulatory Visit: Payer: Self-pay | Admitting: Podiatry

## 2024-01-08 ENCOUNTER — Encounter: Payer: Self-pay | Admitting: Neurology

## 2024-01-08 DIAGNOSIS — G20A1 Parkinson's disease without dyskinesia, without mention of fluctuations: Secondary | ICD-10-CM

## 2024-01-14 MED ORDER — PRAMIPEXOLE DIHYDROCHLORIDE ER 3 MG PO TB24: 3.0000 mg | ORAL_TABLET | Freq: Every evening | ORAL | 11 refills | Status: AC

## 2024-01-14 MED ORDER — PRAMIPEXOLE DIHYDROCHLORIDE ER 0.375 MG PO TB24: 0.3750 mg | ORAL_TABLET | Freq: Every evening | ORAL | 11 refills | Status: AC

## 2024-01-30 ENCOUNTER — Other Ambulatory Visit: Payer: Self-pay | Admitting: Podiatry

## 2024-03-10 NOTE — Progress Notes (Deleted)
**Note De-Identified Dunklee Obfuscation** [facility] [address] [place]. [place] [address] [phone]       OFFICE FOLLOW UP NOTE  Bethany Carr Date of Birth:  [birth year] Medical Record Number:  [id number]    Primary neurologist: Dr. Buck Reason for visit: Parkinson's disease    SUBJECTIVE:  CHIEF COMPLAINT:  No chief complaint on file.    HPI:   Update 03/11/2024 Bethany Carr: Patient returns for follow-up visit.  At prior visit, she continued on pramipexole  but was switched to XR formulation.  Dosage is gradually increased since prior visit Bethany Carr MyChart messages and currently on 3.375 mg nightly.           Update 08/09/2023 Bethany Carr: Patient returns for follow-up visit after prior visit with Dr. Buck 6 months ago.  Pramipexole  dose increased to 0.25 mg 3 times daily.  During the interval time, dosage increased to 0.5 mg 3 times daily due to continued tremors.  Patient sent MyChart message and reporting she gradually self increased dose to 2 mg 3 times daily, as this is a higher than recommended maximum dose she was advised to gradually decrease to 1 mg 3 times daily.  Reports currently taking 0.5 mg 3 times daily as this is what the prescription was written for, reports some improvement of tremors and stiffness but still can occur occasionally and feels more internal type tremor.  She questions dosage increase and possibly changing to once or twice daily as she has difficulty taking afternoon dose on time.  She has been having some more difficulty with anxiety.  Occasionally can have difficulty driving and feels increased anxiety can worsen tremor.  She does have a prescription for Xanax as needed per PCP but rarely uses and has not used in the past 3 months.  She is not interested in daily medication for anxiety at this time.  She continues to work without great difficulty, can have occasional difficulty typing on the keyboard.  Gait mildly impaired feeling more unsteady especially in the morning or  after sitting for prolonged period of time.  No recent falls.  She is interested in looking into different exercise classes for overall strengthening, she also questions different herbal remedies/supplements that may help with Parkinson's.   History from Dr. Buck prior OV note for reference purposes only Bethany Carr is a 63 year old right-handed female with an underlying medical history of allergies, thrombocytopenia, hypothyroidism, hyperlipidemia, anxiety, asthma, and obesity with a BMI of over 40, who presents for follow-up consultation of her left sided tremor, in keeping with parkinsonism, after an interim DaTscan .  The patient is unaccompanied today.  She missed an appointment on 01/10/2023. I first met her at the request of her primary care physician on 10/24/2022, at which time she reported a several month history of left-sided tremors.  She also had noticed fine motor dyscontrol.  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  She was advised to proceed with a DaTscan .  She had a nuclear medicine DaTscan  on 11/30/2022 and I reviewed the results:   IMPRESSION: Decreased radiotracer activity in the posterior RIGHT putamen. Asymmetric activity in the heads of the caudate nuclei.   Of note, DaTSCAN  is not diagnostic of Parkinsonian syndromes, which remains a clinical diagnosis. DaTscan  is an adjuvant test to aid in the clinical diagnosis of Parkinsonian syndromes.   In addition, I reviewed images through the PACS system. She was notified of the test results by phone call.   We started her on low-dose pramipexole  in August 2024 after **Note De-Identified Hailes Obfuscation** her DaTscan .   Today, 01/25/2023: She reports doing better with the pramipexole , tolerates the medication, currently taking 0.125 mg of pramipexole  3 times daily, usually at 8 AM, 4 PM and 10 or 11 PM which is her bedtime.  Her daughter got married in early September 2024, the patient feels that she was able to get through the wedding with less stress and less tremors  because of the pramipexole .  She tries to stay active mentally and physically, still works full-time.  She has made some lifestyle changes, she has done some reading on Parkinson's disease and has signed up for the [employer].  She has a family history of aortic aneurysm and she is in regular follow-up with cardiology and usually has an echocardiogram once a year. She had an echocardiogram today, aortic root was normal.   Previously:   10/24/2022: (She) reports a history of left-sided tremors and fine motor dyscontrol in the left upper and lower extremities for the past several months, symptoms started in October 2023 and have been plateaued for the most part.  In fact, she feels that her left leg tremor has actually improved.  I reviewed your office note from 09/05/2022.  She was started on metoprolol 25 mg daily long-acting, for concern for essential tremor.  This was recently increased to 50 mg once daily about a week ago.  She also had blood work in your office about a week ago, we will request blood test results as these are not currently available for my review today.  She does not believe that the tremor has responded to the beta-blocker.  She is not aware of any family history of tremors or Parkinson's disease.  She has been seeing a chiropractor who noticed stiffness in the left upper extremity.  She is right-handed.  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  She can work from home about 3 days a week.  She drinks caffeine in limitation, usually 1 cup of coffee.  She tries to hydrate well.  She has rare alcohol, she is a non-smoker.  She lives with her husband and currently their 78 year old daughter lives with along with her 74-month-old daughter.  They also have a son who is 80 and lives in [place] .  She has not fallen.  She has had stress, particularly with regards to her mom's health, mom is 95 and lives in [place] .  She has had more  anxiety.  Patient uses Xanax as needed.  She denies any significant snoring, may have occasional snoring, feels that she sleeps fairly well currently.        ROS:   14 system review of systems performed and negative with exception of those listed in HPI  PMH:  Past Medical History:  Diagnosis Date   Anxiety    Asthma    Breast cyst, right    Dyslipidemia    Estrogen deficiency    History of ITP    during pregnancy   Hypothyroidism    Morbid obesity (HCC)    Right hip pain    pt states back pain   Seasonal allergic rhinitis     PSH:  Past Surgical History:  Procedure Laterality Date   BREAST BIOPSY Right 2021   CESAREAN SECTION      Social History:  Social History   Socioeconomic History   Marital status: Married    Spouse name: Not on file   Number of children: Not on **Note De-Identified Issac Obfuscation** file   Years of education: Not on file   Highest education level: Not on file  Occupational History   Occupation: Nurse  Tobacco Use   Smoking status: Never   Smokeless tobacco: Never  Vaping Use   Vaping status: Never Used  Substance and Sexual Activity   Alcohol use: No   Drug use: No   Sexual activity: Yes  Other Topics Concern   Not on file  Social History Narrative   Caffiene 2 cups daily   Married   2 kids   Education some college   Works as an [employer]   Social Drivers of Corporate Investment Banker Strain: Not on [employer] Insecurity: Not on file  Transportation Needs: Not on file  Physical Activity: Not on file  Stress: Not on file  Social Connections: Not on file  Intimate Partner Violence: Not on file    Family History:  Family History  Problem Relation Age of Onset   Hypertension Mother    Breast cancer Mother        hx of 1988   Heart failure Mother    Aortic aneurysm Father        abdominal   Breast cancer Sister    Aortic aneurysm Brother        abdominal   Healthy Brother    Healthy Brother     Medications:   Current Outpatient Medications on File Prior  to Visit  Medication Sig Dispense Refill   Pramipexole  Dihydrochloride 0.375 MG TB24 Take 1 tablet (0.375 mg total) by mouth at bedtime. Combine with 3mg  tablet for total daily amt of 3.375 30 tablet 11   Pramipexole  Dihydrochloride 3 MG TB24 Take 1 tablet (3 mg total) by mouth at bedtime. Combine with 0.375mg  tablet for total daily amt of 3.375 30 tablet 11   ALPRAZolam (XANAX) 0.5 MG tablet Take 0.5 mg by mouth 2 (two) times daily as needed.     atorvastatin (LIPITOR) 40 MG tablet Take 40 mg by mouth daily.     Loratadine (ALAVERT PO) Take 1 tablet by mouth daily at 6 (six) AM.     meloxicam  (MOBIC ) 15 MG tablet TAKE 1 TABLET BY MOUTH EVERY DAY AS NEEDED FOR PAIN 30 tablet 0   Multiple Vitamin (MULTIVITAMIN ADULT PO) MVI     spironolactone  (ALDACTONE ) 25 MG tablet Take 1 tablet (25 mg total) by mouth daily. 90 tablet 3   No current facility-administered medications on file prior to visit.    Allergies:  No Known Allergies    OBJECTIVE:  Physical Exam  There were no vitals filed for this visit.  There is no height or weight on file to calculate BMI. No results found.  General: well developed, well nourished, very pleasant middle-aged female, seated, in no evident distress  Neurologic Exam Mental Status: Awake and fully alert.  Fluent speech and language.  Oriented to place and time. Recent and remote memory intact. Attention span, concentration and fund of knowledge appropriate. Mood and affect appropriate.  Cranial Nerves: Pupils equal, briskly reactive to light. Extraocular movements full without nystagmus. Visual fields full to confrontation. Hearing intact. Facial sensation intact. Face, tongue, palate moves normally and symmetrically.  Motor: Normal strength in all tested extremity muscles.  Mildly increased tone LUE with mild resting tremor and very slight postural tremor.  No resting tremor in lower extremities or RUE. Sensory.: intact to touch , pinprick , position and  vibratory sensation.  Coordination: Rapid alternating movements slightly **Note De-Identified Placido Obfuscation** decreased LUE. Finger-to-nose and heel-to-shin performed accurately bilaterally. Gait and Station: Arises from chair without difficulty. Stance is normal. Gait demonstrates normal stride length and step height, decreased left arm swing and slight tremor, mild difficulty with turns.  Reflexes: 1+ and symmetric. Toes downgoing.       ASSESSMENT/PLAN: Norely Schlick Isa is a 63 y.o. year old female with left sided predominant Parkinson's disease.  DaTscan  on 11/2022 consistent with Parkinson's disease.      Parkinson's disease:  Will change pramipexole  to extended release form -currently on 1.5 total daily dosage, will increase to 1.875 nightly (1.5mg  tab + 0.375mg  tab).  Advised to call after 1 to 2 weeks if tremors remain bothersome for further dosage increase.  Max dose 4.5 mg daily Information provided for rock steady program or consider formal PT evaluation Continue to monitor anxiety, currently declines interest in medication management Continue to stay active with routine activity/exercise, ensuring good sleep, healthy diet and adequate water intake     Follow up in 6 months or call earlier if needed   CC:  PCP: Elliot Charm, MD    I spent 30 minutes of face-to-face and non-face-to-face time with patient.  This included previsit chart review, lab review, study review, order entry, electronic health record documentation, patient education and discussion regarding above diagnoses and treatment plan and answered all other questions to patient's satisfaction  Harlene Bogaert, [facility]  [facility] [address] [place], [place] [address]  Phone [phone] Fax [phone] Note: This document was prepared with digital dictation and possible smart phrase technology. Any transcriptional errors that result from this process are unintentional.

## 2024-03-11 ENCOUNTER — Encounter: Payer: Self-pay | Admitting: Adult Health

## 2024-03-11 ENCOUNTER — Ambulatory Visit: Admitting: Adult Health

## 2024-03-13 ENCOUNTER — Encounter: Payer: Self-pay | Admitting: Adult Health

## 2024-03-17 NOTE — Progress Notes (Unsigned)
**Note De-Identified Soler Obfuscation** [facility] [address] [place]. [place] [address] [phone]       OFFICE FOLLOW UP NOTE  Ms. Mersades Redd Locicero Date of Birth:  Jan 04, 1961 Medical Record Number:  [id number]    Primary neurologist: Dr. Buck Reason for visit: Parkinson's disease    SUBJECTIVE:  CHIEF COMPLAINT:  No chief complaint on file.    HPI:   Update 03/18/2024 JM: Patient returns for follow-up visit.  At prior visit, she continued on pramipexole  but was switched to XR formulation.  Dosage is gradually increased since prior visit [place] MyChart messages and currently on 3.375 mg nightly.           Update 08/09/2023 JM: Patient returns for follow-up visit after prior visit with Dr. Buck 6 months ago.  Pramipexole  dose increased to 0.25 mg 3 times daily.  During the interval time, dosage increased to 0.5 mg 3 times daily due to continued tremors.  Patient sent MyChart message and reporting she gradually self increased dose to 2 mg 3 times daily, as this is a higher than recommended maximum dose she was advised to gradually decrease to 1 mg 3 times daily.  Reports currently taking 0.5 mg 3 times daily as this is what the prescription was written for, reports some improvement of tremors and stiffness but still can occur occasionally and feels more internal type tremor.  She questions dosage increase and possibly changing to once or twice daily as she has difficulty taking afternoon dose on time.  She has been having some more difficulty with anxiety.  Occasionally can have difficulty driving and feels increased anxiety can worsen tremor.  She does have a prescription for Xanax as needed per PCP but rarely uses and has not used in the past 3 months.  She is not interested in daily medication for anxiety at this time.  She continues to work without great difficulty, can have occasional difficulty typing on the keyboard.  Gait mildly impaired feeling more unsteady especially in the morning or  after sitting for prolonged period of time.  No recent falls.  She is interested in looking into different exercise classes for overall strengthening, she also questions different herbal remedies/supplements that may help with Parkinson's.   History from Dr. Buck prior OV note for reference purposes only Ms. Carel is a 63 year old right-handed female with an underlying medical history of allergies, thrombocytopenia, hypothyroidism, hyperlipidemia, anxiety, asthma, and obesity with a BMI of over 40, who presents for follow-up consultation of her left sided tremor, in keeping with parkinsonism, after an interim DaTscan .  The patient is unaccompanied today.  She missed an appointment on 01/10/2023. I first met her at the request of her primary care physician on 10/24/2022, at which time she reported a several month history of left-sided tremors.  She also had noticed fine motor dyscontrol.  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  She was advised to proceed with a DaTscan .  She had a nuclear medicine DaTscan  on 11/30/2022 and I reviewed the results:   IMPRESSION: Decreased radiotracer activity in the posterior RIGHT putamen. Asymmetric activity in the heads of the caudate nuclei.   Of note, DaTSCAN  is not diagnostic of Parkinsonian syndromes, which remains a clinical diagnosis. DaTscan  is an adjuvant test to aid in the clinical diagnosis of Parkinsonian syndromes.   In addition, I reviewed images through the PACS system. She was notified of the test results by phone call.   We started her on low-dose pramipexole  in August 2024 after **Note De-Identified Tout Obfuscation** her DaTscan .   Today, 01/25/2023: She reports doing better with the pramipexole , tolerates the medication, currently taking 0.125 mg of pramipexole  3 times daily, usually at 8 AM, 4 PM and 10 or 11 PM which is her bedtime.  Her daughter got married in early September 2024, the patient feels that she was able to get through the wedding with less stress and less tremors  because of the pramipexole .  She tries to stay active mentally and physically, still works full-time.  She has made some lifestyle changes, she has done some reading on Parkinson's disease and has signed up for the [employer].  She has a family history of aortic aneurysm and she is in regular follow-up with cardiology and usually has an echocardiogram once a year. She had an echocardiogram today, aortic root was normal.   Previously:   10/24/2022: (She) reports a history of left-sided tremors and fine motor dyscontrol in the left upper and lower extremities for the past several months, symptoms started in October 2023 and have been plateaued for the most part.  In fact, she feels that her left leg tremor has actually improved.  I reviewed your office note from 09/05/2022.  She was started on metoprolol 25 mg daily long-acting, for concern for essential tremor.  This was recently increased to 50 mg once daily about a week ago.  She also had blood work in your office about a week ago, we will request blood test results as these are not currently available for my review today.  She does not believe that the tremor has responded to the beta-blocker.  She is not aware of any family history of tremors or Parkinson's disease.  She has been seeing a chiropractor who noticed stiffness in the left upper extremity.  She is right-handed.  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  She can work from home about 3 days a week.  She drinks caffeine in limitation, usually 1 cup of coffee.  She tries to hydrate well.  She has rare alcohol, she is a non-smoker.  She lives with her husband and currently their 71 year old daughter lives with along with her 26-month-old daughter.  They also have a son who is 22 and lives in [place] .  She has not fallen.  She has had stress, particularly with regards to her mom's health, mom is 95 and lives in [place] .  She has had more  anxiety.  Patient uses Xanax as needed.  She denies any significant snoring, may have occasional snoring, feels that she sleeps fairly well currently.        ROS:   14 system review of systems performed and negative with exception of those listed in HPI  PMH:  Past Medical History:  Diagnosis Date   Anxiety    Asthma    Breast cyst, right    Dyslipidemia    Estrogen deficiency    History of ITP    during pregnancy   Hypothyroidism    Morbid obesity (HCC)    Right hip pain    pt states back pain   Seasonal allergic rhinitis     PSH:  Past Surgical History:  Procedure Laterality Date   BREAST BIOPSY Right 2021   CESAREAN SECTION      Social History:  Social History   Socioeconomic History   Marital status: Married    Spouse name: Not on file   Number of children: Not on **Note De-Identified Kiedrowski Obfuscation** file   Years of education: Not on file   Highest education level: Not on file  Occupational History   Occupation: Nurse  Tobacco Use   Smoking status: Never   Smokeless tobacco: Never  Vaping Use   Vaping status: Never Used  Substance and Sexual Activity   Alcohol use: No   Drug use: No   Sexual activity: Yes  Other Topics Concern   Not on file  Social History Narrative   Caffiene 2 cups daily   Married   2 kids   Education some college   Works as an [employer]   Social Drivers of Corporate Investment Banker Strain: Not on [employer] Insecurity: Not on file  Transportation Needs: Not on file  Physical Activity: Not on file  Stress: Not on file  Social Connections: Not on file  Intimate Partner Violence: Not on file    Family History:  Family History  Problem Relation Age of Onset   Hypertension Mother    Breast cancer Mother        hx of 1988   Heart failure Mother    Aortic aneurysm Father        abdominal   Breast cancer Sister    Aortic aneurysm Brother        abdominal   Healthy Brother    Healthy Brother     Medications:   Current Outpatient Medications on File Prior  to Visit  Medication Sig Dispense Refill   ALPRAZolam (XANAX) 0.5 MG tablet Take 0.5 mg by mouth 2 (two) times daily as needed.     atorvastatin (LIPITOR) 40 MG tablet Take 40 mg by mouth daily.     Loratadine (ALAVERT PO) Take 1 tablet by mouth daily at 6 (six) AM.     meloxicam  (MOBIC ) 15 MG tablet TAKE 1 TABLET BY MOUTH EVERY DAY AS NEEDED FOR PAIN 30 tablet 0   Multiple Vitamin (MULTIVITAMIN ADULT PO) MVI     Pramipexole  Dihydrochloride 0.375 MG TB24 Take 1 tablet (0.375 mg total) by mouth at bedtime. Combine with 3mg  tablet for total daily amt of 3.375 30 tablet 11   Pramipexole  Dihydrochloride 3 MG TB24 Take 1 tablet (3 mg total) by mouth at bedtime. Combine with 0.375mg  tablet for total daily amt of 3.375 30 tablet 11   spironolactone  (ALDACTONE ) 25 MG tablet Take 1 tablet (25 mg total) by mouth daily. 90 tablet 3   No current facility-administered medications on file prior to visit.    Allergies:  No Known Allergies    OBJECTIVE:  Physical Exam  There were no vitals filed for this visit.  There is no height or weight on file to calculate BMI. No results found.  General: well developed, well nourished, very pleasant middle-aged female, seated, in no evident distress  Neurologic Exam Mental Status: Awake and fully alert.  Fluent speech and language.  Oriented to place and time. Recent and remote memory intact. Attention span, concentration and fund of knowledge appropriate. Mood and affect appropriate.  Cranial Nerves: Pupils equal, briskly reactive to light. Extraocular movements full without nystagmus. Visual fields full to confrontation. Hearing intact. Facial sensation intact. Face, tongue, palate moves normally and symmetrically.  Motor: Normal strength in all tested extremity muscles.  Mildly increased tone LUE with mild resting tremor and very slight postural tremor.  No resting tremor in lower extremities or RUE. Sensory.: intact to touch , pinprick , position and  vibratory sensation.  Coordination: Rapid alternating movements slightly **Note De-Identified Jeffreys Obfuscation** decreased LUE. Finger-to-nose and heel-to-shin performed accurately bilaterally. Gait and Station: Arises from chair without difficulty. Stance is normal. Gait demonstrates normal stride length and step height, decreased left arm swing and slight tremor, mild difficulty with turns.  Reflexes: 1+ and symmetric. Toes downgoing.       ASSESSMENT/PLAN: Irys Nigh Bianca is a 63 y.o. year old female with left sided predominant Parkinson's disease.  DaTscan  on 11/2022 consistent with Parkinson's disease.      Parkinson's disease:  Will change pramipexole  to extended release form -currently on 1.5 total daily dosage, will increase to 1.875 nightly (1.5mg  tab + 0.375mg  tab).  Advised to call after 1 to 2 weeks if tremors remain bothersome for further dosage increase.  Max dose 4.5 mg daily Information provided for rock steady program or consider formal PT evaluation Continue to monitor anxiety, currently declines interest in medication management Continue to stay active with routine activity/exercise, ensuring good sleep, healthy diet and adequate water intake     Follow up in 6 months or call earlier if needed   CC:  PCP: Elliot Charm, MD      Harlene Bogaert, AGNP-BC  [facility] [address] [place], [place] [address]  Phone [phone] Fax [phone] Note: This document was prepared with digital dictation and possible smart phrase technology. Any transcriptional errors that result from this process are unintentional.

## 2024-03-18 ENCOUNTER — Ambulatory Visit: Admitting: Adult Health

## 2024-03-18 ENCOUNTER — Encounter: Payer: Self-pay | Admitting: Adult Health

## 2024-03-18 VITALS — BP 148/92 | HR 93 | Ht 62.0 in | Wt 238.2 lb

## 2024-03-18 DIAGNOSIS — G20A1 Parkinson's disease without dyskinesia, without mention of fluctuations: Secondary | ICD-10-CM

## 2024-03-18 MED ORDER — RASAGILINE MESYLATE 0.5 MG PO TABS: 0.5000 mg | ORAL_TABLET | Freq: Every day | ORAL | 5 refills | Status: DC

## 2024-03-18 NOTE — Patient Instructions (Signed)
**Note De-Identified Carmen Obfuscation** Your Plan:  Continue pramipexole  3.375 mg daily  Start Azilect 0.5mg  daily to further help with tremor, can consider increasing to 1 mg if needed after a couple weeks  Referrals placed to physical and occupational therapy for Parkinsons disease  Consider evaluation by eye doctor to ensure no ocular concerns that contributed to your recent accident.     Follow up in 6 months or call earlier if needed     Thank you for coming to see us  at [facility]. I hope we have been able to provide you high quality care today.  You may receive a patient satisfaction survey over the next few weeks. We would appreciate your feedback and comments so that we may continue to improve ourselves and the health of our patients.      Rasagiline Tablets What is this medication? RASAGILINE (ra SA ji leen) treats the symptoms of Parkinson disease. It works by increasing the amount of dopamine in your brain, a substance which helps manage body movements and coordination. This reduces the symptoms of Parkinson, such as body stiffness and tremors. It belongs to a group of medications called MAOIs. This medicine may be used for other purposes; ask your health care provider or pharmacist if you have questions. COMMON BRAND NAME(S): Azilect What should I tell my care team before I take this medication? They need to know if you have any of these conditions: Frequently drink alcohol High blood pressure Liver disease Low blood pressure Mental health conditions Sleep disorders Urges to engage in impulsive behaviors in ways that are unusual for you An unusual or allergic reaction to rasagiline, other medications, foods, dyes, or preservatives Pregnant or trying to get pregnant Breast-feeding How should I use this medication? Take this medication by mouth with water. Take it as directed on the prescription label at the same time every day. You can take it with or without food. If it upsets  your stomach, take it with food. Keep taking it unless your care team tells you to stop. Talk to your care team about the use of this medication in children. Special care may be needed. Overdosage: If you think you have taken too much of this medicine contact a poison control center or emergency room at once. NOTE: This medicine is only for you. Do not share this medicine with others. What if I miss a dose? If you miss a dose, take it as soon as you can. If it is almost time for your next dose, take only that dose. Do not take double or extra doses. What may interact with this medication? Do not take this medication with any of the following: Cyclobenzaprine Dextromethorphan Linezolid Other MAOIs, such as Marplan, Nardil, and Parnate Methylene blue Opioids Stimulant medications for ADHD, weight loss, or staying awake Supplements, such as St. John's wort or tryptophan This medication may also interact with the following: Alcohol Antihistamines for allergy, cough and cold Certain medications for depression, anxiety, or other mental health conditions Ciprofloxacin Decongestants, including nasal sprays or eye drops Isoniazid Medications that help you fall asleep Metoclopramide This list may not describe all possible interactions. Give your health care provider a list of all the medicines, herbs, non-prescription drugs, or dietary supplements you use. Also tell them if you smoke, drink alcohol, or use illegal drugs. Some items may interact with your medicine. What should I watch for while using this medication? Visit your care team for regular checks on your progress. Tell your care team if your symptoms **Note De-Identified Roehm Obfuscation** do not start to get better or if they get worse. Do not stop taking except on your care team's advice. You may develop a severe reaction. Your care team will tell you how much medication to take. This medication may affect your coordination, reaction time, or judgement. Do not drive or operate  machinery until you know how this medication affects you. Sit up or stand slowly to reduce the risk of dizzy or fainting spells. Drinking alcohol with this medication can increase the risk of these side effects. When taking this medication, you may fall asleep without notice. You may be doing activities like driving a car, talking, or eating. You may not feel drowsy before it happens. Contact your care team right away if this happens to you. There have been reports of increased sexual urges or other strong urges such as gambling while taking this medication. If you experience any of these while taking this medication, you should report this to your care team as soon as possible. Foods that contain very high amounts of tyramine, such as aged, fermented, cured, smoked and pickled foods, should be avoided while taking this medication. The combination may cause a dangerous rise in blood pressure. Ask your care team, pharmacist, or nutritionist for a complete listing of foods and beverages that are high in tyramine. If you consume a food or beverage very rich in tyramine and do not feel well soon after eating, contact your care team. Some medications may interact with this medication and could cause adverse effects. Talk to your care team if you are taking or planning to take any over-the-counter medications, especially cough remedies or decongestants, including nasal sprays or eye drops. This medication may also interact with antidepressants and certain medications for pain. Contact your care team before taking new medications including antidepressants, pain medications, or prescription or over-the-counter medications for congestion, cough, colds, or allergies. If you are scheduled for any medical or dental procedure, tell your care team that you are taking this medication. This medication can interact with other medications used during surgery. What side effects may I notice from receiving this medication? Side  effects that you should report to your care team as soon as possible: Allergic reactions--skin rash, itching, hives, swelling of the face, lips, tongue, or throat Falling asleep during daily activities Increase in blood pressure Low blood pressure--dizziness, feeling faint or lightheaded, blurry vision Mood and behavior changes--anxiety, nervousness, irritability and restlessness, confusion, hallucinations, feeling distrust or suspicion of others New or worsening uncontrolled and repetitive movements of the face, mouth, or upper body Urges to engage in impulsive behaviors such as gambling, binge eating, sexual activity, or shopping in ways that are unusual for you Side effects that usually do not require medical attention (report to your care team if they continue or are bothersome): Flu-like symptoms--fever, chills, muscle pain, cough, headache, fatigue Joint pain Upset stomach This list may not describe all possible side effects. Call your doctor for medical advice about side effects. You may report side effects to FDA at 1-800-FDA-1088. Where should I keep my medication? Keep out of the reach of children and pets. Store at room temperature between 20 and 25 degrees C (68 and 77 degrees F). Get rid of any unused medication after the expiration date. To get rid of medications that are no longer needed or have expired: Take the medication to a take-back program. Check with your pharmacy or law enforcement to find a location. If you cannot return the medication, check the label **Note De-Identified Picha Obfuscation** or package insert to see if the medication should be thrown out in the garbage or flushed down the toilet. If you are not sure, ask your care team. If it is safe to put it in the trash, empty the medication out of the container. Mix the medication with cat litter, dirt, coffee grounds, or other unwanted substance. Seal the mixture in a bag or container. Put it in the trash. NOTE: This sheet is a summary. It may not cover all  possible information. If you have questions about this medicine, talk to your doctor, pharmacist, or health care provider.  2024 Elsevier/Gold Standard (2021-07-27 00:00:00)

## 2024-04-06 ENCOUNTER — Other Ambulatory Visit: Payer: Self-pay | Admitting: Podiatry

## 2024-04-14 ENCOUNTER — Other Ambulatory Visit: Payer: Self-pay | Admitting: Adult Health

## 2024-04-14 DIAGNOSIS — G20A1 Parkinson's disease without dyskinesia, without mention of fluctuations: Secondary | ICD-10-CM

## 2024-04-15 ENCOUNTER — Encounter: Payer: Self-pay | Admitting: Adult Health

## 2024-04-15 NOTE — Telephone Encounter (Signed)
**Note De-Identified Kurka Obfuscation** From last OV: Your Plan:   Continue pramipexole  3.375 mg daily   Start Azilect  0.5mg  daily to further help with tremor, can consider increasing to 1 mg if needed after a couple weeks   Referrals placed to physical and occupational therapy for Parkinsons disease   Consider evaluation by eye doctor to ensure no ocular concerns that contributed to your recent accident.

## 2024-04-16 MED ORDER — RASAGILINE MESYLATE 1 MG PO TABS: 1.0000 mg | ORAL_TABLET | Freq: Every day | ORAL | 11 refills | Status: DC

## 2024-05-08 ENCOUNTER — Ambulatory Visit: Admitting: Occupational Therapy

## 2024-05-08 ENCOUNTER — Ambulatory Visit: Attending: Internal Medicine | Admitting: Physical Therapy

## 2024-05-08 ENCOUNTER — Encounter: Payer: Self-pay | Admitting: Physical Therapy

## 2024-05-08 ENCOUNTER — Other Ambulatory Visit: Payer: Self-pay

## 2024-05-08 ENCOUNTER — Encounter: Payer: Self-pay | Admitting: Occupational Therapy

## 2024-05-08 VITALS — BP 121/90 | HR 86

## 2024-05-08 DIAGNOSIS — R29818 Other symptoms and signs involving the nervous system: Secondary | ICD-10-CM

## 2024-05-08 DIAGNOSIS — G20A1 Parkinson's disease without dyskinesia, without mention of fluctuations: Secondary | ICD-10-CM | POA: Diagnosis not present

## 2024-05-08 DIAGNOSIS — R278 Other lack of coordination: Secondary | ICD-10-CM | POA: Diagnosis present

## 2024-05-08 DIAGNOSIS — R29898 Other symptoms and signs involving the musculoskeletal system: Secondary | ICD-10-CM | POA: Diagnosis present

## 2024-05-08 DIAGNOSIS — R2689 Other abnormalities of gait and mobility: Secondary | ICD-10-CM | POA: Insufficient documentation

## 2024-05-08 DIAGNOSIS — R2681 Unsteadiness on feet: Secondary | ICD-10-CM | POA: Insufficient documentation

## 2024-05-08 NOTE — Therapy (Signed)
**Note De-Identified Riester Obfuscation** " OUTPATIENT OCCUPATIONAL THERAPY PARKINSON'S EVALUATION  Patient Name: Bethany Carr MRN: [id number] DOB:[birth year], 64 y.o., female Today's Date: 05/08/2024  PCP: Elliot Charm, MD  REFERRING PROVIDER: Whitfield Raisin, NP   END OF SESSION:  OT End of Session - 05/08/24 0854     Visit Number 1    Number of Visits 13   Pt only scheduled for 4 weeks   Date for Recertification  06/20/24    Authorization Type [employer]    OT Start Time [phone]    OT Stop Time 0931    OT Time Calculation (min) 38 min    Activity Tolerance Patient tolerated treatment well    Behavior During Therapy [facility] for tasks assessed/performed          Past Medical History:  Diagnosis Date   Anxiety    Asthma    Breast cyst, right    Dyslipidemia    Estrogen deficiency    History of ITP    during pregnancy   Hypothyroidism    Morbid obesity (HCC)    Right hip pain    pt states back pain   Seasonal allergic rhinitis    Past Surgical History:  Procedure Laterality Date   BREAST BIOPSY Right 2021   CESAREAN SECTION     Patient Active Problem List   Diagnosis Date Noted   Peripheral edema 03/22/2016   Essential hypertension 03/22/2016   Morbid obesity (HCC) 03/22/2016   Snoring 03/22/2016   Family history of abdominal aortic aneurysm 03/22/2016    ONSET DATE: 03/18/2024 (Date of referral)  REFERRING DIAG: G20.A1 (ICD-10-CM) - Parkinson's disease without dyskinesia or fluctuating manifestations   THERAPY DIAG:  Other symptoms and signs involving the nervous system  Other lack of coordination  Other symptoms and signs involving the musculoskeletal system  Rationale for Evaluation and Treatment: Rehabilitation  SUBJECTIVE:   SUBJECTIVE STATEMENT: Works 2 days in office and 3 days from home. Wears glasses for reading only, no changes due to PD reported. Her 79 yo mother is currently in [facility] due to a UTI with plans to d/c to a rehab facility. Started taking medications in the  morning in the last week + with benefit to sx.  Pt accompanied by: self  PERTINENT HISTORY: PMH: HTN, hypothyroidism, hyperlipidemia, anxiety, left sided predominant Parkinson's disease (diagnosed in 2024)   PRECAUTIONS: None  WEIGHT BEARING RESTRICTIONS: No  PAIN:  Are you having pain? No  FALLS: Has patient fallen in last 6 months? No  LIVING ENVIRONMENT: Lives with: lives with their spouse Lives in: House/apartment Stairs: Yes: Internal: 12 steps; on right going up and External: 5 steps; can reach both Has following equipment at home: None that she is currently using though has some from when she was caring for her mother  PLOF: Independent; driving; [employer]; sketching, baking, music  PATIENT GOALS: manage PD sx  OBJECTIVE:  Note: Objective measures were completed at Evaluation unless otherwise noted.  HAND DOMINANCE: Right  ADLs: Overall ADLs: Independent  IADLs: Overall IADLs: Independent  Driving: has been limiting driving Handwriting: Increased time and shakier  MOBILITY STATUS: Independent  POSTURE COMMENTS:  rounded shoulders  ACTIVITY TOLERANCE: Activity tolerance: good to fair  FUNCTIONAL OUTCOME MEASURES: Fastening/unfastening 3 buttons: 25 seconds Physical performance test: PPT#2 (simulated eating) 11 seconds & PPT#4 (donning/doffing jacket): 14 seconds  COORDINATION: 9 Hole Peg test: Right: 22 sec; Left: 28 sec Box and Blocks:  Right 50blocks, Left 56blocks Tremors: Resting and Left  UE ROM: **Note De-Identified Dinino Obfuscation** Not tested  UE MMT:   Not tested  SENSATION: Paresthesias at night in R hand, which she attributes to spinal alignment/positioning, age-related changes  MUSCLE TONE: NT  COGNITION: Overall cognitive status: Within functional limits for tasks assessed  OBSERVATIONS: L resting tremor of hand. Limited impact from tremor with FM activities with testing today. Pt ambulates without use of AD. No loss of balance. The pt appears well kept and has  glasses donned on top of her head.                                                                                                                  TREATMENT:  OT educated pt on rehabilitation process and results of objective measures in relation to pt specific goals.     PATIENT EDUCATION: Education details: OT Role and POC Person educated: Patient Education method: Explanation Education comprehension: verbalized understanding  HOME EXERCISE PROGRAM: N/A for this visit  GOALS:  SHORT TERM GOALS: Target date: 06/05/2024    Pt will be independent with PD specific HEP.  Baseline: not yet initiated Goal status: INITIAL  2.  Pt will verbalize understanding of adapted strategies to maximize safety and independence with ADLs/IADLs.  Baseline: not yet initiated Goal status: INITIAL   LONG TERM GOALS: Target date: 06/20/2024   Pt will verbalize understanding of ways to prevent future PD related complications and PD community resources.  Baseline: not yet initiated Goal status: INITIAL  2.  Pt will write a short paragraph with no significant decrease in size, maintaining 100% legibility, and with pt report of more stability.  Baseline:  Goal status: INITIAL  3.  Pt will verbalize understanding of ways to keep thinking skills sharp and ways to compensate for STM changes in the future.  Baseline: not yet initiated Goal status: INITIAL  ASSESSMENT:  CLINICAL IMPRESSION: Patient is a 64 y.o. female who was seen today for occupational therapy evaluation for PD management. Hx includes HTN, hypothyroidism, hyperlipidemia, anxiety, left sided predominant Parkinson's disease (diagnosed in 2024). Patient presents to clinic seeking skilled therapy services to better manage occupational barriers secondary to Parkinson's and improve independence and safety with ADLs and IADLs.    PERFORMANCE DEFICITS: in functional skills including ADLs, IADLs, coordination, Fine motor control, Gross motor  control, decreased knowledge of precautions, decreased knowledge of use of DME, and UE functional use.   IMPAIRMENTS: are limiting patient from ADLs, IADLs, work, and leisure.   COMORBIDITIES:  may have co-morbidities  that affects occupational performance. Patient will benefit from skilled OT to address above impairments and improve overall function.  MODIFICATION OR ASSISTANCE TO COMPLETE EVALUATION: No modification of tasks or assist necessary to complete an evaluation.  OT OCCUPATIONAL PROFILE AND HISTORY: Detailed assessment: Review of records and additional review of physical, cognitive, psychosocial history related to current functional performance.  CLINICAL DECISION MAKING: LOW - limited treatment options, no task modification necessary  REHAB POTENTIAL: Good  EVALUATION COMPLEXITY: Low    PLAN:  OT FREQUENCY: 2x/week  OT DURATION: **Note De-Identified Steeves Obfuscation** 4- 6 weeks  PLANNED INTERVENTIONS: 97168 OT Re-evaluation, 97535 self care/ADL training, 02889 therapeutic exercise, 97530 therapeutic activity, 97112 neuromuscular re-education, 97140 manual therapy, 97035 ultrasound, 97018 paraffin, 02960 fluidotherapy, 97750 Physical Performance Testing, coping strategies training, patient/family education, and DME and/or AE instructions  RECOMMENDED OTHER SERVICES: N/A for this visit  CONSULTED AND AGREED WITH PLAN OF CARE: Patient  PLAN FOR NEXT SESSION: PD HEP   Jocelyn CHRISTELLA Bottom, OT 05/08/2024, 2:08 PM   "
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
## 2024-05-13 ENCOUNTER — Ambulatory Visit: Admitting: Physical Therapy

## 2024-05-13 ENCOUNTER — Ambulatory Visit: Admitting: Occupational Therapy

## 2024-05-15 ENCOUNTER — Ambulatory Visit: Admitting: Occupational Therapy

## 2024-05-20 ENCOUNTER — Encounter: Payer: Self-pay | Admitting: Adult Health

## 2024-05-20 ENCOUNTER — Ambulatory Visit: Admitting: Physical Therapy

## 2024-05-20 ENCOUNTER — Ambulatory Visit: Admitting: Occupational Therapy

## 2024-05-22 ENCOUNTER — Ambulatory Visit: Admitting: Occupational Therapy

## 2024-05-23 ENCOUNTER — Other Ambulatory Visit: Payer: Self-pay | Admitting: Adult Health

## 2024-05-23 DIAGNOSIS — G20A1 Parkinson's disease without dyskinesia, without mention of fluctuations: Secondary | ICD-10-CM

## 2024-05-27 ENCOUNTER — Ambulatory Visit: Admitting: Physical Therapy

## 2024-05-27 ENCOUNTER — Encounter: Payer: Self-pay | Admitting: Physical Therapy

## 2024-05-27 ENCOUNTER — Ambulatory Visit: Admitting: Occupational Therapy

## 2024-05-27 VITALS — BP 110/82 | HR 100

## 2024-05-27 DIAGNOSIS — R29818 Other symptoms and signs involving the nervous system: Secondary | ICD-10-CM | POA: Diagnosis not present

## 2024-05-27 DIAGNOSIS — R29898 Other symptoms and signs involving the musculoskeletal system: Secondary | ICD-10-CM

## 2024-05-27 DIAGNOSIS — R278 Other lack of coordination: Secondary | ICD-10-CM

## 2024-05-27 DIAGNOSIS — R2689 Other abnormalities of gait and mobility: Secondary | ICD-10-CM

## 2024-05-27 DIAGNOSIS — R2681 Unsteadiness on feet: Secondary | ICD-10-CM

## 2024-05-27 NOTE — Therapy (Signed)
**Note De-Identified Pitcher Obfuscation** " OUTPATIENT OCCUPATIONAL THERAPY PARKINSON'S TREATMENT  Patient Name: Bethany Carr MRN: [id number] DOB:05/31/1960, 64 y.o., female Today's Date: 05/27/2024  PCP: Elliot Charm, MD  REFERRING PROVIDER: Whitfield Raisin, NP   END OF SESSION:  OT End of Session - 05/27/24 1453     Visit Number 2    Number of Visits 13   Pt only scheduled for 4 weeks   Date for Recertification  06/20/24    Authorization Type [employer]    OT Start Time 1452    OT Stop Time 1530    OT Time Calculation (min) 38 min    Activity Tolerance Patient tolerated treatment well    Behavior During Therapy WFL for tasks assessed/performed         Past Medical History:  Diagnosis Date   Anxiety    Asthma    Breast cyst, right    Dyslipidemia    Estrogen deficiency    History of ITP    during pregnancy   Hypothyroidism    Morbid obesity (HCC)    Right hip pain    pt states back pain   Seasonal allergic rhinitis    Past Surgical History:  Procedure Laterality Date   BREAST BIOPSY Right 2021   CESAREAN SECTION     Patient Active Problem List   Diagnosis Date Noted   Peripheral edema 03/22/2016   Essential hypertension 03/22/2016   Morbid obesity (HCC) 03/22/2016   Snoring 03/22/2016   Family history of abdominal aortic aneurysm 03/22/2016    ONSET DATE: 03/18/2024 (Date of referral)  REFERRING DIAG: G20.A1 (ICD-10-CM) - Parkinson's disease without dyskinesia or fluctuating manifestations   THERAPY DIAG:  Other symptoms and signs involving the nervous system  Other lack of coordination  Other symptoms and signs involving the musculoskeletal system  Rationale for Evaluation and Treatment: Rehabilitation  SUBJECTIVE:   SUBJECTIVE STATEMENT: She did not bring in a binder today to keep her HEP. She has had difficulty with LB dressing (getting L leg in, especially while attempting in standing), putting on seatbelt, and laundry.  Pt accompanied by: self  PERTINENT HISTORY: PMH: HTN,  hypothyroidism, hyperlipidemia, anxiety, left sided predominant Parkinson's disease (diagnosed in 2024)   PRECAUTIONS: None  WEIGHT BEARING RESTRICTIONS: No  PAIN:  Are you having pain? No  FALLS: Has patient fallen in last 6 months? No  LIVING ENVIRONMENT: Lives with: lives with their spouse Lives in: House/apartment Stairs: Yes: Internal: 12 steps; on right going up and External: 5 steps; can reach both Has following equipment at home: None that she is currently using though has some from when she was caring for her mother  PLOF: Independent; driving; [employer] for [employer]; sketching, baking, music  PATIENT GOALS: manage PD sx  OBJECTIVE:  Note: Objective measures were completed at Evaluation unless otherwise noted.  HAND DOMINANCE: Right  ADLs: Overall ADLs: Independent  IADLs: Overall IADLs: Independent  Driving: has been limiting driving Handwriting: Increased time and shakier  MOBILITY STATUS: Independent  POSTURE COMMENTS:  rounded shoulders  ACTIVITY TOLERANCE: Activity tolerance: good to fair  FUNCTIONAL OUTCOME MEASURES: Fastening/unfastening 3 buttons: 25 seconds Physical performance test: PPT#2 (simulated eating) 11 seconds & PPT#4 (donning/doffing jacket): 14 seconds  COORDINATION: 9 Hole Peg test: Right: 22 sec; Left: 28 sec Box and Blocks:  Right 50blocks, Left 56blocks Tremors: Resting and Left  UE ROM:  Not tested  UE MMT:   Not tested  SENSATION: Paresthesias at night in R hand, which she attributes to spinal alignment/positioning, age-related changes **Note De-Identified Cobos Obfuscation** MUSCLE TONE: NT  COGNITION: Overall cognitive status: Within functional limits for tasks assessed  OBSERVATIONS: L resting tremor of hand. Limited impact from tremor with FM activities with testing today. Pt ambulates without use of AD. No loss of balance. The pt appears well kept and has glasses donned on top of her head.                                                                                                                   TREATMENT:   - Therapeutic exercises completed for duration as noted below including:  OT initiated PWR! Hands as noted in pt instructions to improve BUE ROM and pain while reducing dyskinesias.    - Self-care/home management completed for duration as noted below including:  Patient educated on performing daily activities using BIG, deliberate movements to improve postural alignment, movement amplitude, and task efficiency. Specific areas addressed included dressing, grooming, and reaching tasks with emphasis on upright posture, wide base of support, open hand patterns, long strokes, and exaggerated reach. Patient instructed to practice at least 2 ADLs/day at home using BIG movements. Will continue to incorporate into treatment to improve motor control and functional independence with practice for specific activities as directed by pt.   PATIENT EDUCATION: Education details: BIG ADLs; PWR! Hands Person educated: Patient Education method: Explanation, Demonstration, Verbal cues, and Handouts Education comprehension: verbalized understanding, returned demonstration, verbal cues required, and needs further education  HOME EXERCISE PROGRAM: 05/27/2024: BIG ADLs; PWR! Hands  GOALS:  SHORT TERM GOALS: Target date: 06/05/2024    Pt will be independent with PD specific HEP.  Baseline: not yet initiated Goal status: IN PROGRESS  2.  Pt will verbalize understanding of adapted strategies to maximize safety and independence with ADLs/IADLs.  Baseline: not yet initiated Goal status: INITIAL   LONG TERM GOALS: Target date: 06/20/2024   Pt will verbalize understanding of ways to prevent future PD related complications and PD community resources.  Baseline: not yet initiated Goal status: INITIAL  2.  Pt will write a short paragraph with no significant decrease in size, maintaining 100% legibility, and with pt report of more stability.  Baseline:  Goal  status: INITIAL  3.  Pt will verbalize understanding of ways to keep thinking skills sharp and ways to compensate for STM changes in the future.  Baseline: not yet initiated Goal status: INITIAL  ASSESSMENT:  CLINICAL IMPRESSION: Patient is a 64 y.o. female who was seen today for occupational therapy evaluation for PD management. Good understanding of PWR! Hands and BIG ADL strategies. Will go over specific ADL/IADL strategies during future visits to improve independence and safety with ADL and IADL completion.  PERFORMANCE DEFICITS: in functional skills including ADLs, IADLs, coordination, Fine motor control, Gross motor control, decreased knowledge of precautions, decreased knowledge of use of DME, and UE functional use.   IMPAIRMENTS: are limiting patient from ADLs, IADLs, work, and leisure.   COMORBIDITIES:  may have co-morbidities  that affects occupational performance. Patient will benefit from skilled OT **Note De-Identified Gartner Obfuscation** to address above impairments and improve overall function.  REHAB POTENTIAL: Good  PLAN:  OT FREQUENCY: 2x/week  OT DURATION: 4- 6 weeks  PLANNED INTERVENTIONS: 02831 OT Re-evaluation, 97535 self care/ADL training, 02889 therapeutic exercise, 97530 therapeutic activity, 97112 neuromuscular re-education, 97140 manual therapy, 97035 ultrasound, 97018 paraffin, 02960 fluidotherapy, 97750 Physical Performance Testing, coping strategies training, patient/family education, and DME and/or AE instructions  RECOMMENDED OTHER SERVICES: N/A for this visit  CONSULTED AND AGREED WITH PLAN OF CARE: Patient  PLAN FOR NEXT SESSION: review PWR! Hands; initiate tremor reduction strategies  For later visits: handwriting; LB dressing (L leg in first, sitting); putting on seatbelt; laundry   Jocelyn CHRISTELLA Bottom, OT 05/27/2024, 4:00 PM   "

## 2024-05-27 NOTE — Therapy (Signed)
**Note De-Identified [place] Obfuscation** " OUTPATIENT PHYSICAL THERAPY NEURO TREATMENT   Patient Name: Bethany Carr MRN: [id number] DOB:15-Mar-1961, 64 y.o., female Today's Date: 05/27/2024   PCP: Elliot Charm, MD  REFERRING PROVIDER: Whitfield Raisin, NP  END OF SESSION:  PT End of Session - 05/27/24 1535     Visit Number 2    Number of Visits 5    Date for Recertification  06/07/24    Authorization Type [employer]    PT Start Time 1533    PT Stop Time 1613    PT Time Calculation (min) 40 min    Equipment Utilized During Treatment Gait belt    Activity Tolerance Patient tolerated treatment well    Behavior During Therapy WFL for tasks assessed/performed          Past Medical History:  Diagnosis Date   Anxiety    Asthma    Breast cyst, right    Dyslipidemia    Estrogen deficiency    History of ITP    during pregnancy   Hypothyroidism    Morbid obesity (HCC)    Right hip pain    pt states back pain   Seasonal allergic rhinitis    Past Surgical History:  Procedure Laterality Date   BREAST BIOPSY Right 2021   CESAREAN SECTION     Patient Active Problem List   Diagnosis Date Noted   Peripheral edema 03/22/2016   Essential hypertension 03/22/2016   Morbid obesity (HCC) 03/22/2016   Snoring 03/22/2016   Family history of abdominal aortic aneurysm 03/22/2016    ONSET DATE: 03/18/2024  REFERRING DIAG: G20.A1 (ICD-10-CM) - Parkinson's disease without dyskinesia or fluctuating manifestations (HCC)  THERAPY DIAG:  Other symptoms and signs involving the nervous system  Other abnormalities of gait and mobility  Unsteadiness on feet  Rationale for Evaluation and Treatment: Rehabilitation  SUBJECTIVE:                                                                                                                                                                                             SUBJECTIVE STATEMENT: Reports that picking up her pants is challenging. Takes one step at a  time instead of an alternating pattern. Unsure if it is balance or picking up her legs. Still having tremors with the medications. No falls or almost falls.    Pt accompanied by: self  PERTINENT HISTORY: PMH: HTN, hypothyroidism, hyperlipidemia, anxiety, left sided predominant Parkinson's disease (diagnosed in 2024)   PAIN:  Are you having pain? No  Vitals:   05/27/24 1539  BP: 110/82  Pulse: 100      PRECAUTIONS: None **Note De-Identified [place] Obfuscation** FALLS: Has patient fallen in last 6 months? 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  DIAGNOSTIC FINDINGS:  She had a nuclear medicine DaTscan  on 11/30/2022 and I reviewed the results:   IMPRESSION: Decreased radiotracer activity in the posterior RIGHT putamen. Asymmetric activity in the heads of the caudate nuclei.  COGNITION: Overall cognitive status: Within functional limits for tasks assessed   SENSATION: WFL Felt a little nervy on L side, but still able to detect  COORDINATION: Heel to shin: WFL   EDEMA:  Incr swelling LLE>RLE MD is aware, does not like compression socks, takes Spironolactone  and it does help some   OBSERVATION:  LUE resting tremor  POSTURE: rounded shoulders   LOWER EXTREMITY MMT:    MMT Right Eval Left Eval  Hip flexion 4+ 4+  Hip extension    Hip abduction    Hip adduction    Hip internal rotation    Hip external rotation    Knee flexion 5 5  Knee extension 5 5  Ankle dorsiflexion 5 5  Ankle  plantarflexion    Ankle inversion    Ankle eversion    (Blank rows = not tested)  BED MOBILITY:  Pt reports it is slow, feels cautious with L leg, keeps her shoes right by the bed. Can tell its weaker getting out of the bed in the morning   TRANSFERS: Sit to stand: Complete Independence  Assistive device utilized: None     Stand to sit: Complete Independence  Assistive device utilized: None      Does report some difficulties getting up from some lower surfaces or couches   GAIT: Findings: Gait Characteristics: step through pattern, decreased arm swing- Right, decreased arm swing- Left, decreased stride length, and decreased trunk rotation, Distance walked: clinic distances, Assistive device utilized:None, Level of assistance: Complete Independence, and Comments: Decr LUE arm swing and held in flexed position   FUNCTIONAL TESTS:  5 times sit to stand: 9.5 seconds with no UE support  10 meter walk test: 9.5 seconds = 3.45 ft/sec                                                                                    TREATMENT DATE: 05/27/24  Therapeutic Activity:  Vitals:   05/27/24 1539  BP: 110/82  Pulse: 100   SciFit with BUE/BLE at Gear 4.0 Multi-Peaks for 8 minutes for reciprocal movements, incr amplitude of stepping, and neural priming. Pt reporting a 6/10 RPE during the hills   NMR:  Pt performs PWR! Moves in standing position   PWR! Up for improved posture 10 reps, plus an additional 10 reps with raising onto toes **Note De-Identified [place] Obfuscation** PWR! Rock for improved weight shifting 10 reps, plus an additional 10 reps with contralateral leg lift for dynamic SLS, intermittent taps for balance   PWR! Twist for improved trunk rotation, 20 reps   PWR! Step for improved step initiation, 20 reps, incr cues to pick up feet esp with L side   Cues provided for technique, larger amplitude movements. Educated on purpose to function. Provided as HEP  Pt reporting RPE as 6-7/10   Slow march at countertop for  controlled SLS, down and back x1 rep, pt noted to have incr wrist and elbow flexion, so performed with alternating UE lift with focus on opening up hands back, down and back x3 reps  On air ex in corner: Narrow BOS EC 30 seconds, EC 10 reps head turns EO marching 10 reps, EC marching 2 x 10 reps   PATIENT EDUCATION: Education details: Additions to HEP - marching in corner/countertop for SLS, standing PWR moves, provided information for [place] for PD support group  Person educated: Patient Education method: Programmer, Multimedia, Demonstration, Verbal cues, and Handouts Education comprehension: verbalized understanding, returned demonstration, and needs further education  HOME EXERCISE PROGRAM: Standing PWR Moves   Access Code: [email or web address] URL: [email or web address] Date: 05/27/2024 Prepared by: Sheffield Senate  Exercises - Walking March  - 1-2 x daily - 5 x weekly - 3 sets - Standing Marching  - 1-2 x daily - 5 x weekly - 2 sets - 10 reps - on foam, eyes open and eyes closed   GOALS: Goals reviewed with patient? Yes  SHORT TERM GOALS: ALL STGS = LTGS   LONG TERM GOALS: Target date: 06/05/2024  Pt will verbalize understanding of local Parkinson's disease resources, including options for continueD community fitness.  Baseline:  Goal status: INITIAL  2.  Pt will be independent in PD specific HEP  Baseline:  Goal status: INITIAL    ASSESSMENT:  CLINICAL IMPRESSION: Initiated PT session on SciFit to work on larger amplitude movements and neural priming. Remainder of session focused on adding to HEP for standing PWR moves and standing balance tasks working on SLS/unlevel surfaces. Pt needing cues intermittently during session to open up hands and extend elbows, esp with LUE, as pt with tendency to hold in a more flexed position, esp with more challenging balance tasks. Will continue per POC.      OBJECTIVE IMPAIRMENTS: Abnormal gait, decreased activity tolerance,  decreased balance, decreased coordination, decreased strength, impaired UE functional use, and postural dysfunction.   ACTIVITY LIMITATIONS: bed mobility and locomotion level  PARTICIPATION LIMITATIONS: driving, community activity, and occupation  PERSONAL FACTORS: Age, Past/current experiences, Time since onset of injury/illness/exacerbation, and 3+ comorbidities: HTN, hypothyroidism, hyperlipidemia, anxiety, left sided predominant Parkinson's disease (diagnosed in 2024)  are also affecting patient's functional outcome.   REHAB POTENTIAL: Good  CLINICAL DECISION MAKING: Stable/uncomplicated  EVALUATION COMPLEXITY: Low  PLAN:  PT FREQUENCY: 1x/week  PT DURATION: 4 weeks  PLANNED INTERVENTIONS: 97164- PT Re-evaluation, 97110-Therapeutic exercises, 97530- Therapeutic activity, 97112- Neuromuscular re-education, 97535- Self Care, [id number]- Manual therapy, [id number]- Gait training, Patient/Family education, Balance training, Stair training, and Vestibular training  PLAN FOR NEXT SESSION: work on SLS, balance tasks unlevel surfaces, dual tasking.  Work on gait with arm swing, any balance tasks with larger amplitude movements and opening up LUE, try SciFit   Add more visits?    Sheffield LOISE Senate, PT, DPT 05/27/2024, 4:15 PM        "

## 2024-05-27 NOTE — Patient Instructions (Addendum)
**Note De-Identified [place] Obfuscation** Performing Daily Activities with Big Movements  Pick at least 2 activities a day and perform with BIG, DELIBERATE movements/effort.  Try different activities each day. This can make the activity easier and turn daily activities into exercise to prevent problems in the future!  If you are standing during the activity, make sure to keep feet apart and stand with good/big/posture.  Examples: Dressing  Pull-over shirt:  good posture, bring shirt to head (don't bring head down), deliberately push arms into sleeves Jacket:  stand with feet apart, deliberately push arms into sleeves Underwear/Pants/Shoes/Socks:  Sit, lean forward, and push each foot in deliberately 1 at a time Open hands to pull down shirt/put on socks/pull up pants--get more material in your hand  Buttoning - Open hands big before fastening each button, deliberate movement (angry buttons--push button through hole), unfasten by using pull-push method   Bathing - Wash/dry with long strokes  Brushing your teeth - Big, slow movements  Cutting food - Long deliberate cuts, put tip of knife down in front of food  Eating - Hold utensil in the middle (not the end), hold fork straight up and down to stab food  Picking up a cup/bottle - Open hand up big and get object all the way in palm  Opening jar/bottle - Move as much as you can with each turn, twist wrist  Putting on seatbelt - Feet apart, twist when reaching across body, look at where you are reaching  Hanging up clothes/getting clothes down from closet - Reach with big effort, open hand, straighten elbow  Putting away groceries/dishes - Reach with big effort  Wiping counter/table - Move in big, long strokes, open hand  Stirring while cooking - Exaggerate movement  Cleaning windows - Feet apart, move in big, long strokes  Sweeping/Raking- Feet apart and one foot in front of the other, move arms in big, long strokes  Vacuuming - Feet apart and one in front of the other,  push with big movement  Folding clothes - Exaggerate arm movements  Washing car - Move in big, long strokes, feet apart  Changing light bulb or Using a screwdriver - Move as much as you can with each turn, twist wrist  Walking into a store/restaurant - Walk with big steps, good posture, swing arms if able  Standing up from a chair/recliner/sofa - Scoot forward, lean forward, and stand with big effort  Picking up something from floor/reaching in low cabinet - Get close to object, position feet apart and one in front of the other  PWR! Hand Exercises Perform each exercise at least 10 repetitions 1x/day, and PWR! PUSH throughout the day when you are having trouble using your hands (picking up small objects, writing, eating, typing, buttoning, etc.). ** Make each movement big and deliberate; feel the movement.  PWR! UP: Fists to open fingers BIG  PWR! Rock:  Move wrists up and down [employer]! Twist: Twist palms up and down BIG  PWR! Step: Step your thumb to index finger while keeping other fingers straight. Flick fingers out BIG (thumb out/straighten fingers). Repeat with other fingers.  PWR! PUSH: Push hands out BIG. Elbows straight, wrists up, fingers open and spread apart BIG.

## 2024-05-29 ENCOUNTER — Ambulatory Visit: Admitting: Occupational Therapy

## 2024-06-03 ENCOUNTER — Encounter: Payer: Self-pay | Admitting: Physical Therapy

## 2024-06-03 ENCOUNTER — Ambulatory Visit: Admitting: Physical Therapy

## 2024-06-03 ENCOUNTER — Ambulatory Visit: Admitting: Occupational Therapy

## 2024-06-03 VITALS — BP 118/85 | HR 96

## 2024-06-03 DIAGNOSIS — R2681 Unsteadiness on feet: Secondary | ICD-10-CM

## 2024-06-03 DIAGNOSIS — R29898 Other symptoms and signs involving the musculoskeletal system: Secondary | ICD-10-CM

## 2024-06-03 DIAGNOSIS — R29818 Other symptoms and signs involving the nervous system: Secondary | ICD-10-CM

## 2024-06-03 DIAGNOSIS — R278 Other lack of coordination: Secondary | ICD-10-CM

## 2024-06-03 NOTE — Patient Instructions (Signed)
   Compensation Strategies for Tremors  When eating, try the following: . Eat out of bowls, divided plates, or use a plate guard (available at a medical supply store) and eat with a spoon so that you have an edge to scoop up food. . Try raising your plate so that there is less distance between the plate and mouth. . Try stabilizing elbows on the table or against your body. . Use utensil with built-up/larger grips as they are easier to hold.  When writing, try the following:   . Stabilize forearm on the table . Take your time as rushing/being stressed can increase tremors. . Try a felt-tipped pen, it does not glide as much.  Avoid gel pens (they move too much). . Consider using pre-printed labels with your name and address (carry them with you when you go out) or you can get stamps with your address or signature on it. . Use a small tape recorder to record messages/reminders for yourself. . Use pens with bigger grips.  When brushing your teeth, putting on make-up, or styling hair, try the following: . Use an electric toothbrush. . Use items with built-up grips. . Stabilize your elbows against your body or on the counter. . Use long-handled brushes/combs. . Use a hair dryer with a stand.  In general: . Avoid stress, fatigue, or rushing as this can increase tremors. . Sit down for activities that require more control/coordination.

## 2024-06-05 ENCOUNTER — Ambulatory Visit: Admitting: Occupational Therapy

## 2024-06-05 DIAGNOSIS — R278 Other lack of coordination: Secondary | ICD-10-CM

## 2024-06-05 DIAGNOSIS — R29898 Other symptoms and signs involving the musculoskeletal system: Secondary | ICD-10-CM

## 2024-06-05 DIAGNOSIS — R29818 Other symptoms and signs involving the nervous system: Secondary | ICD-10-CM

## 2024-06-05 NOTE — Patient Instructions (Addendum)
**Note De-Identified [place] Obfuscation** Suggestions for Handwriting Changes Many people with Parkinson's notice changes in their handwriting.  Handwriting often becomes small and cramped, and can become more difficult to control when writing for longer periods of time.  This handwriting change is called micrographia.  Why does micrographia occur?  Parkinson's can cause slowing of movement, and feelings of muscle stiffness in the hands and fingers.  Loss of automatic motion also affects the easy, flowing motion of handwriting.  This can impact even simple writing tasks such as signing your name or writing a shopping list.  Attempts to write quickly without thinking about forming each letter contributes to small, cramped handwriting, and may cause the hand to develop a feeling of tightness.  How can I make writing easier?  Make a deliberate effort to form each letter.  This can be hard to do at first, but is very effective in improving size and legibility of handwriting.  Use a pen grip (round or triangular shaped rubber or foam cylinders available at stationery stores or where writing materials are found) or a larger size pen to keep your hand more relaxed.  Try printing rather than writing in a cursive style. Printing causes you to pause briefly between each letter, keeping writing more legible.   Using lined paper may provide a visual target to keep all letters big when writing.   A ballpoint pen typically works better than felt tip or rolling writer/gel styles.  Rest your hand if it begins to feel tight.  Pause briefly when you see your handwriting becoming smaller.  Avoid hurrying or trying to write long passages if you are feeling stressed or fatigued.  Practice helps!  Remind yourself to slow down, aim big, and pause often!  Perform flicks/PWR! Hands if your hand feels tight, your writing gets smaller, before you start writing, or if tremors increase.  Involving your team: An occupational therapist can provide  assessment and individual recommendations for improvement of your handwriting.  This handout was adapted from parkinson.org National Parkinson foundation   Keeping Thinking Skills Sharp:  1. Jigsaw puzzles  2. Card/board games  3. Talking on the phone/going to social events  4. Lumosity.com  5. Online games  6. Word searches/crossword puzzles  7.  Logic puzzles  8. Aerobic exercise (stationary bike)  9. Eating balanced diet (fruits & veggies)  10. Drink water  11. Try something new--new recipe, hobby  12. Crafts  13. Do a variety of activities that are challenging  14.  Plan weekly meals and write a grocery list  15. Add cognitive activities to walking/exercising (think of animal/food/city with each letter of the alphabet, counting backwards, thinking of as many vegetables as you can, etc.).--Only do this if safe (no freezing/falls).   Ways to prevent future Parkinson's related complications:  1.   Exercise regularly/daily. Challenge yourself SAFELY and try to get different types of exercise including: PWR! Moves, Cardio (cycling), strength and balance training, and stretches (Yoga)   2.   Focus on BIGGER movements during daily activities- really reach overhead, straighten elbows and extend fingers  3.   When dressing (especially jacket/coat) or reaching for your seatbelt make sure to use your body to assist by twisting while you reach and looking at where you are reaching - this can help to minimize stress on the shoulder and reduce the risk of a rotator cuff tear  4.   Swing your arms when you walk (unless using walker)! People with PD are at increased risk for frozen **Note De-Identified [place] Obfuscation** shoulder and swinging your arms can reduce this risk.  5. Drink plenty of water and eat a high fiber diet (fresh fruits and veggies, nuts/seeds, beans, some fish). Avoid canned foods, reduce sugar intake and dairy when possible  6. Do NOT take your Parkinson's medication around meals (take at least 30  min before a meal, or 1 hour after a meal), especially protein as it blocks the absorption of the medicine and will not work effectively  7. Keep your feet apart when you are standing (wider stance) to allow you to have better balance and to reach further with your arms. Also make sure your feet are apart before standing up.   8. Stay social! - Go out with friends, play card games with others, join a community group or community PD exercise class  9. Get a good night's sleep! Staying active during the day and developing a good bedtime routine can help with this.   10. Communicate with your neurologist any concerns and maintain overall health - keep blood pressure regulated, reduce stress/anxiety, good nutrition/gut health, etc.    MRI-guided focused ultrasound (MRgFUS) thalamotomy 1. Targeting the Brain Area That Causes Tremor Hand tremors are often generated in a small part of the brain called the VIM nucleus of the thalamus. MRgFUS uses ultrasound waves to precisely target and heat this deep-brain structure without opening the skull.  2. MRI + Ultrasound = Precision Surgery The procedure combines: MRI scanning Provides real-time imaging Helps doctors visualize brain structures Tracks the temperature during treatment Focused ultrasound Up to 1,000 sound waves converge on a single point deep in the brain Individually they're harmless, but at the focal spot they generate enough heat to ablate (destroy) the tremor-producing cells  3. Step-by-Step Process ?? Step 1: Preparing the Patient A frame is placed on the head to keep it still The patient lies in an MRI scanner Cold water circulates in a helmet to protect the scalp ?? Step 2: Test Pulses Low-energy ultrasound waves are sent first to confirm: Target accuracy Reduction of tremor while still reversible (no permanent damage yet) Patients are awake and can perform tasks so the team can visually check for tremor improvement. ?? Step  3: Therapeutic Sonication Once targeting is confirmed: Higher-energy ultrasound heats the target tissue to ~55-60C This creates a tiny lesion in the VIM Tremor reduction is usually immediate  4. Benefits Non-invasive (no incisions, no burr holes) Immediate tremor relief Outpatient procedure Quick recovery No implanted stimulator or battery replacements  5. Limitations & Risks Usually done on one side of the brain (dominant hand) Not reversible once lesion is created Possible side effects: tingling, balance issues, or speech effects (often temporary) Not every tremor type responds equally  6. Who It Helps Most Patients with essential tremor People with tremor-dominant Parkinson's disease Those who cannot or do not want to undergo deep brain stimulation (DBS)

## 2024-06-05 NOTE — Therapy (Signed)
**Note De-Identified [place] [facility]** " OUTPATIENT OCCUPATIONAL THERAPY PARKINSON'S TREATMENT and DISCHARGE  Patient Name: Bethany Carr MRN: [id number] DOB:02-Nov-1960, 64 y.o., female Today's Date: 06/05/2024  PCP: Elliot Charm, MD  REFERRING PROVIDER: Whitfield Raisin, NP   END OF SESSION:  OT End of Session - 06/05/24 0850     Visit Number 4    Number of Visits 13   Pt only scheduled for 4 weeks   Date for Recertification  06/20/24    Authorization Type [employer]    OT Start Time [phone]    OT Stop Time 0932    OT Time Calculation (min) 39 min    Activity Tolerance Patient tolerated treatment well    Behavior During Therapy [facility] for tasks assessed/performed         Past Medical History:  Diagnosis Date   Anxiety    Asthma    Breast cyst, right    Dyslipidemia    Estrogen deficiency    History of ITP    during pregnancy   Hypothyroidism    Morbid obesity (HCC)    Right hip pain    pt states back pain   Seasonal allergic rhinitis    Past Surgical History:  Procedure Laterality Date   BREAST BIOPSY Right 2021   CESAREAN SECTION     Patient Active Problem List   Diagnosis Date Noted   Peripheral edema 03/22/2016   Essential hypertension 03/22/2016   Morbid obesity (HCC) 03/22/2016   Snoring 03/22/2016   Family history of abdominal aortic aneurysm 03/22/2016    ONSET DATE: 03/18/2024 (Date of referral)  REFERRING DIAG: G20.A1 (ICD-10-CM) - Parkinson's disease without dyskinesia or fluctuating manifestations   THERAPY DIAG:  Other symptoms and signs involving the musculoskeletal system  Other symptoms and signs involving the nervous system  Other lack of coordination  Rationale for Evaluation and Treatment: Rehabilitation  SUBJECTIVE:   SUBJECTIVE STATEMENT: She feels like she will be able to carryover HEP and strategies following OT d/c.  Pt accompanied by: self  PERTINENT HISTORY: PMH: HTN, hypothyroidism, hyperlipidemia, anxiety, left sided predominant Parkinson's disease  (diagnosed in 2024)   PRECAUTIONS: None  WEIGHT BEARING RESTRICTIONS: No  PAIN:  Are you having pain? No  FALLS: Has patient fallen in last 6 months? No  LIVING ENVIRONMENT: Lives with: lives with their spouse Lives in: House/apartment Stairs: Yes: Internal: 12 steps; on right going up and External: 5 steps; can reach both Has following equipment at home: None that she is currently using though has some from when she was caring for her mother  PLOF: Independent; driving; [employer]; sketching, baking, music  PATIENT GOALS: manage PD sx  OBJECTIVE:  Note: Objective measures were completed at Evaluation unless otherwise noted.  HAND DOMINANCE: Right  ADLs: Overall ADLs: Independent  IADLs: Overall IADLs: Independent  Driving: has been limiting driving Handwriting: Increased time and shakier  MOBILITY STATUS: Independent  POSTURE COMMENTS:  rounded shoulders  ACTIVITY TOLERANCE: Activity tolerance: good to fair  FUNCTIONAL OUTCOME MEASURES: Fastening/unfastening 3 buttons: 25 seconds Physical performance test: PPT#2 (simulated eating) 11 seconds & PPT#4 (donning/doffing jacket): 14 seconds  COORDINATION: 9 Hole Peg test: Right: 22 sec; Left: 28 sec Box and Blocks:  Right 50blocks, Left 56blocks Tremors: Resting and Left  UE ROM:  Not tested  UE MMT:   Not tested  SENSATION: Paresthesias at night in R hand, which she attributes to spinal alignment/positioning, age-related changes  MUSCLE TONE: NT  COGNITION: Overall cognitive status: Within functional limits for tasks assessed **Note De-Identified [place] [employer]** OBSERVATIONS: L resting tremor of hand. Limited impact from tremor with FM activities with testing today. Pt ambulates without use of AD. No loss of balance. The pt appears well kept and has glasses donned on top of her head.                                                                                                                  TREATMENT:   - Self-care/home management  completed for duration as noted below including:  Provide education for MRI-guided focused ultrasound (MRgFUS) thalamotomy as noted in pt instructions to review with physician as desired for tremor management.  OT provided education for ways to prevent future PD related complications and ways to keep thinking skills sharp as noted in pt instructions.   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 Education was provided using the handout as noted in pt instructions titled Suggestions for Handwriting Changes (adapted from [email or web address]). Topics reviewed included: Causes of micrographia (slowed movement, stiffness, loss of automaticity) Strategies to improve legibility (e.g., deliberate letter formation, use of pen grips, lined paper, printing vs. cursive) Environmental and behavioral modifications (e.g., rest breaks, avoiding fatigue, using ballpoint pens) Therapeutic exercises (e.g., PWR! Hands/flicks) Pt practiced writing using lined paper and a pen with a built-up grip. Demonstrated improved letter size and spacing with verbal cues (slower pacing) and visual feedback.   PATIENT EDUCATION: Education details:  tremor reduction; ways to prevent future PD related complications; ways to keep thinking skills sharp; handwriting changes Person educated: Patient Education method: Explanation, Demonstration, Verbal cues, and Handouts Education comprehension: verbalized understanding, returned demonstration, and verbal cues required  HOME EXERCISE PROGRAM: 05/27/2024: BIG ADLs; PWR! Hands 06/03/2024: tremor reduction 06/05/2024: ways to prevent future PD related complications; ways to keep thinking skills sharp; handwriting changes  GOALS:  SHORT TERM GOALS: Target date: 06/05/2024    Pt will be independent with PD specific HEP.  Baseline: not yet initiated Goal status: MET  2.  Pt will verbalize understanding of adapted strategies to maximize safety and  independence with ADLs/IADLs.  Baseline: not yet initiated Goal status: MET   LONG TERM GOALS: Target date: 06/20/2024   Pt will verbalize understanding of ways to prevent future PD related complications and PD community resources.  Baseline: not yet initiated Goal status: MET  2.  Pt will write a short paragraph with no significant decrease in size, maintaining 100% legibility, and with pt report of more stability.  Baseline:  Goal status: MET  3.  Pt will verbalize understanding of ways to keep thinking skills sharp and ways to compensate for STM changes in the future.  Baseline: not yet initiated Goal status: MET  ASSESSMENT:  OCCUPATIONAL THERAPY DISCHARGE SUMMARY  Visits from Start of Care: 4  Current functional level related to goals / functional outcomes: Patient has met all short and long-term goals to date.   Remaining deficits: Pt remains limited by Parkinson's, which affects his motor movements putting him at increased need for assistance and **Note De-Identified [place] [employer]** falls; however, he has resources and HEP as needed to maintain his current level of function as much as possible.    Education / Equipment: Continue with HEP and strategies following OT d/c to maximize function, manage pain, manage spasticity, and maximize safety and overall level of independence.    Patient agrees to discharge. Patient goals were met. Patient is being discharged due to meeting the stated rehab goals.   CLINICAL IMPRESSION: Patient is a 64 y.o. female who was seen today for occupational therapy evaluation for PD management. Pt has met all goals this session is appropriate for discharge and no longer demonstrates medical necessity for continued skilled occupational therapy services. Return in 6 months for multidisciplinary PD screens.   PLAN:  OT D/C completed  CONSULTED AND AGREED WITH PLAN OF CARE: Patient  Jocelyn CHRISTELLA Bottom, OT 06/05/2024, 11:36 AM   "

## 2024-09-25 ENCOUNTER — Ambulatory Visit: Admitting: Adult Health

## 2024-12-11 ENCOUNTER — Ambulatory Visit: Admitting: Physical Therapy

## 2024-12-11 ENCOUNTER — Ambulatory Visit: Admitting: Occupational Therapy
